# Patient Record
Sex: Male | Born: 1960
Health system: Southern US, Community
[De-identification: ages and names within clinical notes are randomized; demographics above are authoritative.]

---

## 2011-09-29 ENCOUNTER — Ambulatory Visit (INDEPENDENT_AMBULATORY_CARE_PROVIDER_SITE_OTHER): Payer: 59

## 2011-09-29 DIAGNOSIS — R509 Fever, unspecified: Secondary | ICD-10-CM

## 2011-11-19 ENCOUNTER — Other Ambulatory Visit: Payer: Self-pay | Admitting: Physician Assistant

## 2011-12-28 ENCOUNTER — Other Ambulatory Visit: Payer: Self-pay | Admitting: Physician Assistant

## 2011-12-29 NOTE — Telephone Encounter (Signed)
Please pull chat  Alycia Rossetti

## 2011-12-30 ENCOUNTER — Other Ambulatory Visit: Payer: Self-pay | Admitting: *Deleted

## 2012-01-01 ENCOUNTER — Ambulatory Visit (INDEPENDENT_AMBULATORY_CARE_PROVIDER_SITE_OTHER): Payer: 59 | Admitting: Family Medicine

## 2012-01-01 VITALS — BP 126/74 | HR 64 | Temp 98.3°F | Resp 16 | Ht 77.0 in | Wt 221.0 lb

## 2012-01-01 DIAGNOSIS — E785 Hyperlipidemia, unspecified: Secondary | ICD-10-CM

## 2012-01-01 LAB — COMPREHENSIVE METABOLIC PANEL
ALT: 18 U/L (ref 0–53)
AST: 21 U/L (ref 0–37)
Albumin: 4.2 g/dL (ref 3.5–5.2)
Alkaline Phosphatase: 55 U/L (ref 39–117)
BUN: 16 mg/dL (ref 6–23)
CO2: 28 mEq/L (ref 19–32)
Calcium: 9.3 mg/dL (ref 8.4–10.5)
Chloride: 107 mEq/L (ref 96–112)
Creat: 0.95 mg/dL (ref 0.50–1.35)
Glucose, Bld: 97 mg/dL (ref 70–99)
Potassium: 4.6 mEq/L (ref 3.5–5.3)
Sodium: 141 mEq/L (ref 135–145)
Total Bilirubin: 0.6 mg/dL (ref 0.3–1.2)
Total Protein: 7.1 g/dL (ref 6.0–8.3)

## 2012-01-01 LAB — LIPID PANEL
Cholesterol: 181 mg/dL (ref 0–200)
HDL: 44 mg/dL (ref >39)
LDL Cholesterol: 110 mg/dL — ABNORMAL HIGH (ref 0–99)
Total CHOL/HDL Ratio: 4.1 Ratio
Triglycerides: 134 mg/dL (ref <150)
VLDL: 27 mg/dL (ref 0–40)

## 2012-01-01 MED ORDER — ATORVASTATIN CALCIUM 10 MG PO TABS: 10.0000 mg | ORAL_TABLET | Freq: Every day | ORAL | Status: DC

## 2012-01-01 NOTE — Patient Instructions (Signed)

## 2012-01-01 NOTE — Progress Notes (Signed)
51 year old gentleman from [place] who comes in for his cholesterol check. He wrecked exercises regularly by running a mile and doing gym workout. He's had no problems with the myalgias or abdominal pain. He stopped taking his Lipitor 3 days ago when he ran out.  Objective: Healthy-appearing man in no acute distress  Heart: Regular no murmur  Chest: Clear  Abdomen: No hepatosplenomegaly, no HSM  Assessment stable on Lipitor 10 mg  Plan check labs and refill medicine

## 2012-01-02 ENCOUNTER — Telehealth: Payer: Self-pay

## 2012-01-02 NOTE — Telephone Encounter (Signed)
Pt would like to know the specifics of his blood work he knows we called him but he is filling out a form that has to have exact numbers

## 2012-01-02 NOTE — Telephone Encounter (Signed)
GAVE PT LAB VALUES HE NEEDED.

## 2013-01-28 ENCOUNTER — Other Ambulatory Visit: Payer: Self-pay | Admitting: Family Medicine

## 2013-03-10 ENCOUNTER — Ambulatory Visit (INDEPENDENT_AMBULATORY_CARE_PROVIDER_SITE_OTHER): Payer: 59 | Admitting: Family Medicine

## 2013-03-10 VITALS — BP 138/82 | HR 82 | Temp 98.0°F | Resp 18 | Ht 77.0 in | Wt 220.0 lb

## 2013-03-10 DIAGNOSIS — E785 Hyperlipidemia, unspecified: Secondary | ICD-10-CM

## 2013-03-10 DIAGNOSIS — M75100 Unspecified rotator cuff tear or rupture of unspecified shoulder, not specified as traumatic: Secondary | ICD-10-CM

## 2013-03-10 DIAGNOSIS — M7552 Bursitis of left shoulder: Secondary | ICD-10-CM

## 2013-03-10 LAB — COMPREHENSIVE METABOLIC PANEL
ALT: 17 U/L (ref 0–53)
AST: 18 U/L (ref 0–37)
CO2: 25 mEq/L (ref 19–32)
Calcium: 9 mg/dL (ref 8.4–10.5)
Chloride: 107 mEq/L (ref 96–112)
Sodium: 143 mEq/L (ref 135–145)
Total Bilirubin: 0.7 mg/dL (ref 0.3–1.2)
Total Protein: 6.9 g/dL (ref 6.0–8.3)

## 2013-03-10 LAB — LIPID PANEL
LDL Cholesterol: 92 mg/dL (ref 0–99)
VLDL: 30 mg/dL (ref 0–40)

## 2013-03-10 MED ORDER — ATORVASTATIN CALCIUM 10 MG PO TABS: 10.0000 mg | ORAL_TABLET | Freq: Every day | ORAL | Status: DC

## 2013-03-10 NOTE — Patient Instructions (Addendum)
Exercise shoulder as discussed. Continue same current medications. Return in one year, sooner if needed.  May take ibuprofen for the shoulder.

## 2013-03-10 NOTE — Progress Notes (Signed)
Subjective 52 year old man who is here for refill of his lipid-lowering agent. He was last here a little over a year ago. He works a Health and safety inspector job, but he does go to [employer] regularly. He maintained his weight stable. He has no major other risk factors. His father had some hypertension in his older years. The patient is under some stress in life. He is divorced, has a small son. Denies chest pains or cardiorespiratory symptoms. He does have some chronic pain in the left shoulder, which hurts with certain movements. He is taken some over-the-counter ibuprofen for that. He takes his medications faithfully.  Objective: Pleasant healthy-appearing man in no major distress. Neck supple without nodes. Chest clear. Heart regular without murmur. Left shoulder is tender in the subacromial area, not real bad today, but does have some limitation or range of motion.  Assessment: Upper lipidemia Left shoulder pain, probably from chronic subacromial bursitis.  Plan: Discussed passive exercises of the shoulder. If he gets worse can consider x-ray and injection. He can use the ibuprofen on a when necessary basis in the meanwhile.  Check lipids and comprehensive metabolic panel and refill his medication.

## 2013-03-11 ENCOUNTER — Encounter: Payer: Self-pay | Admitting: Family Medicine

## 2014-03-21 ENCOUNTER — Other Ambulatory Visit: Payer: Self-pay | Admitting: Family Medicine

## 2014-05-17 ENCOUNTER — Ambulatory Visit (INDEPENDENT_AMBULATORY_CARE_PROVIDER_SITE_OTHER): Payer: 59 | Admitting: Emergency Medicine

## 2014-05-17 VITALS — BP 126/82 | HR 60 | Temp 97.8°F | Resp 16 | Ht 78.0 in | Wt 220.8 lb

## 2014-05-17 DIAGNOSIS — E785 Hyperlipidemia, unspecified: Secondary | ICD-10-CM | POA: Insufficient documentation

## 2014-05-17 LAB — COMPLETE METABOLIC PANEL WITH GFR
ALK PHOS: 49 U/L (ref 39–117)
ALT: 20 U/L (ref 0–53)
AST: 23 U/L (ref 0–37)
Albumin: 4.3 g/dL (ref 3.5–5.2)
BILIRUBIN TOTAL: 0.9 mg/dL (ref 0.2–1.2)
BUN: 16 mg/dL (ref 6–23)
CO2: 29 mEq/L (ref 19–32)
Calcium: 9 mg/dL (ref 8.4–10.5)
Chloride: 103 mEq/L (ref 96–112)
Creat: 0.93 mg/dL (ref 0.50–1.35)
GFR, Est Non African American: >89 mL/min
Glucose, Bld: 102 mg/dL — ABNORMAL HIGH (ref 70–99)
Potassium: 4.6 mEq/L (ref 3.5–5.3)
SODIUM: 140 meq/L (ref 135–145)
TOTAL PROTEIN: 7 g/dL (ref 6.0–8.3)

## 2014-05-17 LAB — POCT CBC
Granulocyte percent: 63.8 %G (ref 37–80)
HEMATOCRIT: 48.1 % (ref 43.5–53.7)
Hemoglobin: 15.8 g/dL (ref 14.1–18.1)
LYMPH, POC: 1.5 (ref 0.6–3.4)
MCH, POC: 32.1 pg — AB (ref 27–31.2)
MCHC: 32.8 g/dL (ref 31.8–35.4)
MCV: 97.7 fL — AB (ref 80–97)
MID (CBC): 0.3 (ref 0–0.9)
MPV: 6.9 fL (ref 0–99.8)
POC GRANULOCYTE: 3.2 (ref 2–6.9)
POC LYMPH %: 30 % (ref 10–50)
POC MID %: 6.2 %M (ref 0–12)
Platelet Count, POC: 196 10*3/uL (ref 142–424)
RBC: 4.92 M/uL (ref 4.69–6.13)
RDW, POC: 13.6 %
WBC: 5 10*3/uL (ref 4.6–10.2)

## 2014-05-17 LAB — LIPID PANEL
CHOL/HDL RATIO: 3.7 ratio
Cholesterol: 166 mg/dL (ref 0–200)
HDL: 45 mg/dL (ref >39)
LDL CALC: 97 mg/dL (ref 0–99)
TRIGLYCERIDES: 119 mg/dL (ref <150)
VLDL: 24 mg/dL (ref 0–40)

## 2014-05-17 MED ORDER — ATORVASTATIN CALCIUM 10 MG PO TABS: ORAL_TABLET | ORAL | Status: DC

## 2014-05-17 NOTE — Progress Notes (Addendum)
Subjective:  This chart was scribed for Lesle Chris, MD by Carl Best, Medical Scribe. This patient was seen in Room 2 and the patient's care was started at 9:26 AM.   Patient ID: Christian Richards, male    DOB: 1961/05/18, 53 y.o.   MRN: [id number]  HPI HPI Comments: Christian Richards is a 53 y.o. male who presents to the [facility] for a refill of his Lipitor.  He gets a flight physical yearly and has not had a colonoscopy.     History reviewed. No pertinent past medical history. History reviewed. No pertinent past surgical history. History reviewed. No pertinent family history. History   Social History  . Marital Status: Single    Spouse Name: N/A    Number of Children: N/A  . Years of Education: N/A   Occupational History  . Not on file.   Social History Main Topics  . Smoking status: Never Smoker   . Smokeless tobacco: Not on file  . Alcohol Use: Yes     Comment: social  . Drug Use: No  . Sexual Activity: Not on file   Other Topics Concern  . Not on file   Social History Narrative  . No narrative on file   No Known Allergies  Review of Systems   Objective:  Physical Exam  Nursing note and vitals reviewed. Constitutional: He is oriented to person, place, and time. He appears well-developed and well-nourished.  HENT:  Head: Normocephalic and atraumatic.  Eyes: Conjunctivae and EOM are normal. Pupils are equal, round, and reactive to light.  Neck: Normal range of motion. Neck supple. No thyromegaly present.  Cardiovascular: Normal rate, regular rhythm and normal heart sounds.  Exam reveals no gallop and no friction rub.   No murmur heard. Pulmonary/Chest: Effort normal and breath sounds normal. No respiratory distress. He has no wheezes. He has no rales.  Musculoskeletal: Normal range of motion.  Lymphadenopathy:    He has no cervical adenopathy.  Neurological: He is alert and oriented to person, place, and time.  Skin: Skin is warm  and dry.  Psychiatric: He has a normal mood and affect. His behavior is normal.   Results for orders placed in visit on 05/17/14  POCT CBC      Result Value Ref Range   WBC 5.0  4.6 - 10.2 K/uL   Lymph, poc 1.5  0.6 - 3.4   POC LYMPH PERCENT 30.0  10 - 50 %L   MID (cbc) 0.3  0 - 0.9   POC MID % 6.2  0 - 12 %M   POC Granulocyte 3.2  2 - 6.9   Granulocyte percent 63.8  37 - 80 %G   RBC 4.92  4.69 - 6.13 M/uL   Hemoglobin 15.8  14.1 - 18.1 g/dL   HCT, POC 69.6  29.5 - 53.7 %   MCV 97.7 (*) 80 - 97 fL   MCH, POC 32.1 (*) 27 - 31.2 pg   MCHC 32.8  31.8 - 35.4 g/dL   RDW, POC 28.4     Platelet Count, POC 196  142 - 424 K/uL   MPV 6.9  0 - 99.8 fL    BP 126/82  Pulse 60  Temp(Src) 97.8 F (36.6 C) (Oral)  Resp 16  Ht 6\' 6"  (1.981 m)  Wt 220 lb 12.8 oz (100.154 kg)  BMI 25.52 kg/m2  SpO2 97% Assessment & Plan:  Only a brief physical was done. His Lipitor refilled. I  sent a referral for him to get scheduled  For a complete physical  I personally performed the services described in this documentation, which was scribed in my presence. The recorded information has been reviewed and is accurate.

## 2014-06-03 ENCOUNTER — Ambulatory Visit (INDEPENDENT_AMBULATORY_CARE_PROVIDER_SITE_OTHER): Payer: 59 | Admitting: Family Medicine

## 2014-06-03 VITALS — BP 128/66 | HR 60 | Temp 98.3°F | Resp 18 | Ht 78.0 in | Wt 216.0 lb

## 2014-06-03 DIAGNOSIS — M545 Low back pain, unspecified: Secondary | ICD-10-CM

## 2014-06-03 MED ORDER — PREDNISONE 20 MG PO TABS: 40.0000 mg | ORAL_TABLET | Freq: Every day | ORAL | Status: DC

## 2014-06-03 NOTE — Progress Notes (Signed)
   Patient ID: Christian Richards MRN: [id number], DOB: [birth year], 53 y.o. Date of Encounter: 06/03/2014, 10:13 AM  This chart was scribed for Christian Sidle, MD by Julian Hy, ED Scribe. The patient was seen in Room 8. The patient's care was started at 10:13 AM.   PCP: Lucilla Edin, MD   Chief Complaint  Patient presents with  . Back Pain    since this morning     HPI Comments: This is a 53 y.o.male who complains of new, severe right low back pain onset immediately prior to arrival. He describes his pain as "spasmodic, dull and tight". Pt notes he previously had some intermittent, dull right low back pain and right hip pain but notes his current pain is slightly different. His pain is worsened with twisting and turning to the right, and standing up. His pain radiates across the abdomen across his belly button. Christian Richards denies any urinary symptoms, bowel problems, radiating pain to his upper and lower extremities, numbness in the legs, loss of motor power, and fever. His pain is relieved once he is standing. Pt notes he frequently sits at work. Pt has been on Lipitor for 5 years.   Character of pain: Spasmodic, dull and tight Location of pain:  Low back Radiation of pain:  Across the abdomen  History reviewed. No pertinent past medical history.   History reviewed. No pertinent past surgical history.  Patient Active Problem List   Diagnosis Date Noted  . Other and unspecified hyperlipidemia 05/17/2014   History reviewed. No pertinent past medical history. History reviewed. No pertinent past surgical history. No Known Allergies Prior to Admission medications   Medication Sig Start Date End Date Taking? Authorizing Provider  atorvastatin (LIPITOR) 10 MG tablet TAKE 1 TABLET BY MOUTH DAILY. 05/17/14  Yes Collene Gobble, MD   History   Social History  . Marital Status: Single    Spouse Name: N/A    Number of Children: N/A  . Years of Education: N/A   Occupational  History  . Not on file.   Social History Main Topics  . Smoking status: Never Smoker   . Smokeless tobacco: Not on file  . Alcohol Use: Yes     Comment: social  . Drug Use: No  . Sexual Activity: Not on file   Other Topics Concern  . Not on file   Social History Narrative  . No narrative on file    Objective:  middle-aged male in no acute distress. Triage Vitals: Blood pressure 128/66, pulse 60, temperature 98.3 F (36.8 C), temperature source Oral, resp. rate 18, height  (1.981 m), weight 216 lb (97.977 kg), SpO2 97.00%.Body mass index is 24.97 kg/(m^2). Palpation of the back reveals no tenderness CVA:  nontender Abdomen: soft, mildly tender RLQ without guarding or rebound Peripheral pulses:  DP/PT normal Inspection of the back: Reveals no scoliosis Straight-leg raising: negative Motor exam of lower extremity: No abnormal weakness. Reflexes: Symmetric and normal Skin exam: normal  Assessment/Plan: Acute lower back pain without acute neurological findings. 53 y.o. male with Right-sided low back pain without sciatica - Plan: predniSONE (DELTASONE) 20 MG tablet   Hold the lipitor for 4 days as well   10:21 AM- Patient informed of current plan for treatment and evaluation and agrees with plan at this time.    Signed, Christian Sidle, MD  06/03/2014 10:23 AM

## 2014-06-21 ENCOUNTER — Ambulatory Visit (INDEPENDENT_AMBULATORY_CARE_PROVIDER_SITE_OTHER): Payer: 59 | Admitting: Emergency Medicine

## 2014-06-21 ENCOUNTER — Encounter: Payer: Self-pay | Admitting: Emergency Medicine

## 2014-06-21 ENCOUNTER — Ambulatory Visit (INDEPENDENT_AMBULATORY_CARE_PROVIDER_SITE_OTHER): Payer: 59

## 2014-06-21 ENCOUNTER — Other Ambulatory Visit: Payer: Self-pay | Admitting: Emergency Medicine

## 2014-06-21 VITALS — BP 130/70 | HR 61 | Temp 97.7°F | Resp 16 | Ht 77.75 in | Wt 217.0 lb

## 2014-06-21 DIAGNOSIS — M545 Low back pain, unspecified: Secondary | ICD-10-CM

## 2014-06-21 DIAGNOSIS — Z0000 Encounter for general adult medical examination without abnormal findings: Secondary | ICD-10-CM

## 2014-06-21 DIAGNOSIS — Z1329 Encounter for screening for other suspected endocrine disorder: Secondary | ICD-10-CM

## 2014-06-21 DIAGNOSIS — M542 Cervicalgia: Secondary | ICD-10-CM

## 2014-06-21 DIAGNOSIS — Z23 Encounter for immunization: Secondary | ICD-10-CM

## 2014-06-21 DIAGNOSIS — Z136 Encounter for screening for cardiovascular disorders: Secondary | ICD-10-CM

## 2014-06-21 DIAGNOSIS — Z125 Encounter for screening for malignant neoplasm of prostate: Secondary | ICD-10-CM

## 2014-06-21 DIAGNOSIS — Z1211 Encounter for screening for malignant neoplasm of colon: Secondary | ICD-10-CM

## 2014-06-21 DIAGNOSIS — E785 Hyperlipidemia, unspecified: Secondary | ICD-10-CM

## 2014-06-21 LAB — TSH: TSH: 1.292 u[IU]/mL (ref 0.350–4.500)

## 2014-06-21 LAB — POCT URINALYSIS DIPSTICK
Bilirubin, UA: NEGATIVE
Glucose, UA: NEGATIVE
Ketones, UA: NEGATIVE
Leukocytes, UA: NEGATIVE
Nitrite, UA: NEGATIVE
Protein, UA: NEGATIVE
Spec Grav, UA: 1.02
Urobilinogen, UA: 0.2
pH, UA: 5.5

## 2014-06-21 LAB — IFOBT (OCCULT BLOOD): IMMUNOLOGICAL FECAL OCCULT BLOOD TEST: NEGATIVE

## 2014-06-21 MED ORDER — ATORVASTATIN CALCIUM 10 MG PO TABS: ORAL_TABLET | ORAL | Status: DC

## 2014-06-21 NOTE — Progress Notes (Addendum)
Subjective:    Patient ID: Christian Richards, male    DOB: January 25, 1961, 53 y.o.   MRN: [id number] This chart was scribed for Collene Gobble, MD by Littie Deeds, ED Scribe. This patient was seen in room Room/bed 21 and the patient's care was started at 8:16 AM.   HPI HPI Comments: Christian Richards is a 53 y.o. male who presents to the Emergency Department for an annual exam. Patient is currently a single father who exercises regularly. His supervisor at work has Napoleon syndrome and has been giving the patient a hard time, but patient says the supervisor has been getting a bit better through the past 6-8 months. His blood pressure was measured to be 157/93 at the age of 79. He has a FMHx of CA. His mother had breast cancer that metastasized to the liver. His father had leukemia (myeloma) at the age of 57 and also had a hx of HTN, which he took medication for. Patient currently has a lower back pain, and was prescribed prednisone for the pain 2 weeks ago by Dr. Milus Glazier. The medication has been helping, but he still feels some pain with ROM. He notes a right sided neck pain that developed a few days ago that radiates to the right arm, but not to the hand. He has had stiff neck a few times this past year. He denies CP and abdominal pain. Patient also has some concern for his kidneys.    Review of Systems  Constitutional: Negative for fever.  Cardiovascular: Negative for chest pain.  Gastrointestinal: Negative for abdominal pain.  Musculoskeletal: Positive for back pain and neck pain.       Objective:   Physical Exam CONSTITUTIONAL: Well developed/well nourished HEAD: Normocephalic/atraumatic EYES: EOM/PERRL ENMT: Mucous membranes moist NECK: supple no meningeal signs SPINE: entire spine nontender CV: S1/S2 noted, no murmurs/rubs/gallops noted LUNGS: Lungs are clear to auscultation bilaterally, no apparent distress ABDOMEN: soft, nontender, no rebound or guarding GU: no cva  tenderness NEURO: Pt is awake/alert, moves all extremitiesx4 EXTREMITIES: pulses normal, full ROM, deep tendon reflexes of the upper extremities 2+ and symmetrical - motor strength 5/5, DTR lower reflexes are 2+ and symmetrical - motor strength 5/5, straight leg raises - right leg exhibits pain at 85 degrees SKIN: warm, color normal PSYCH: no abnormalities of mood noted RECTAL: exam normal, no nodularity [facility] reading (PRIMARY) by  Dr.Kenan Moodie there is C5-C6 degenerative disc disease on his x-rays. No definite abnormalities of the lumbar spine  Results for orders placed in visit on 05/17/14  LIPID PANEL      Result Value Ref Range   Cholesterol 166  0 - 200 mg/dL   Triglycerides 010  <272 mg/dL   HDL 45  >53 mg/dL   Total CHOL/HDL Ratio 3.7     VLDL 24  0 - 40 mg/dL   LDL Cholesterol 97  0 - 99 mg/dL  COMPLETE METABOLIC PANEL WITH GFR      Result Value Ref Range   Sodium 140  135 - 145 mEq/L   Potassium 4.6  3.5 - 5.3 mEq/L   Chloride 103  96 - 112 mEq/L   CO2 29  19 - 32 mEq/L   Glucose, Bld 102 (*) 70 - 99 mg/dL   BUN 16  6 - 23 mg/dL   Creat 6.64  4.03 - 4.74 mg/dL   Total Bilirubin 0.9  0.2 - 1.2 mg/dL   Alkaline Phosphatase 49  39 - 117 U/L   AST 23  0 - 37 U/L   ALT 20  0 - 53 U/L   Total Protein 7.0  6.0 - 8.3 g/dL   Albumin 4.3  3.5 - 5.2 g/dL   Calcium 9.0  8.4 - 91.4 mg/dL   GFR, Est African American >89     GFR, Est Non African American >89    POCT CBC      Result Value Ref Range   WBC 5.0  4.6 - 10.2 K/uL   Lymph, poc 1.5  0.6 - 3.4   POC LYMPH PERCENT 30.0  10 - 50 %L   MID (cbc) 0.3  0 - 0.9   POC MID % 6.2  0 - 12 %M   POC Granulocyte 3.2  2 - 6.9   Granulocyte percent 63.8  37 - 80 %G   RBC 4.92  4.69 - 6.13 M/uL   Hemoglobin 15.8  14.1 - 18.1 g/dL   HCT, POC 78.2  95.6 - 53.7 %   MCV 97.7 (*) 80 - 97 fL   MCH, POC 32.1 (*) 27 - 31.2 pg   MCHC 32.8  31.8 - 35.4 g/dL   RDW, POC 21.3     Platelet Count, POC 196  142 - 424 K/uL   MPV 6.9  0 - 99.8 fL         Assessment & Plan:  Patient given flu vaccine and TDap. Referral made for screening colonoscopy. Overall he is doing well maintains a healthy lifestyle and is enjoying being with his 61-year-old son. Referral made to [facility] orthopedics to help with his neck and back discomfort I personally performed the services described in this documentation, which was scribed in my presence. The recorded information has been reviewed and is accurate.

## 2014-06-21 NOTE — Progress Notes (Deleted)
   Subjective:    Patient ID: Christian Richards, male    DOB: March 12, 1961, 53 y.o.   MRN: [id number]  HPI    Review of Systems  Constitutional: Negative.   HENT: Negative.   Eyes: Negative.   Respiratory: Negative.   Cardiovascular: Negative.   Gastrointestinal: Negative.   Endocrine: Negative.   Genitourinary: Negative.   Musculoskeletal: Positive for arthralgias, back pain and neck pain.  Allergic/Immunologic: Negative.   Neurological: Negative.   Hematological: Negative.   Psychiatric/Behavioral: Negative.        Objective:   Physical Exam        Assessment & Plan:

## 2014-06-22 LAB — PSA: PSA: 1.97 ng/mL (ref <=4.00)

## 2014-07-22 ENCOUNTER — Encounter: Payer: Self-pay | Admitting: Emergency Medicine

## 2015-07-04 ENCOUNTER — Encounter: Payer: Self-pay | Admitting: Emergency Medicine

## 2015-07-24 ENCOUNTER — Other Ambulatory Visit: Payer: Self-pay | Admitting: Emergency Medicine

## 2015-09-15 ENCOUNTER — Ambulatory Visit (INDEPENDENT_AMBULATORY_CARE_PROVIDER_SITE_OTHER): Payer: 59 | Admitting: Family Medicine

## 2015-09-15 VITALS — BP 114/70 | HR 67 | Temp 98.0°F | Resp 14 | Ht 77.75 in | Wt 220.0 lb

## 2015-09-15 DIAGNOSIS — M25512 Pain in left shoulder: Secondary | ICD-10-CM

## 2015-09-15 DIAGNOSIS — E785 Hyperlipidemia, unspecified: Secondary | ICD-10-CM

## 2015-09-15 LAB — COMPLETE METABOLIC PANEL WITH GFR
ALT: 18 U/L (ref 9–46)
AST: 19 U/L (ref 10–35)
Albumin: 4.2 g/dL (ref 3.6–5.1)
Alkaline Phosphatase: 53 U/L (ref 40–115)
BUN: 15 mg/dL (ref 7–25)
CO2: 30 mmol/L (ref 20–31)
Calcium: 9.3 mg/dL (ref 8.6–10.3)
Chloride: 104 mmol/L (ref 98–110)
Creat: 0.95 mg/dL (ref 0.70–1.33)
GFR, Est African American: >89 mL/min (ref >=60)
GLUCOSE: 86 mg/dL (ref 65–99)
POTASSIUM: 4.9 mmol/L (ref 3.5–5.3)
SODIUM: 141 mmol/L (ref 135–146)
TOTAL PROTEIN: 6.9 g/dL (ref 6.1–8.1)
Total Bilirubin: 0.8 mg/dL (ref 0.2–1.2)

## 2015-09-15 LAB — LIPID PANEL
CHOL/HDL RATIO: 3.6 ratio (ref <=5.0)
CHOLESTEROL: 160 mg/dL (ref 125–200)
HDL: 44 mg/dL (ref >=40)
LDL Cholesterol: 94 mg/dL (ref <130)
Triglycerides: 109 mg/dL (ref <150)
VLDL: 22 mg/dL (ref <30)

## 2015-09-15 NOTE — Progress Notes (Signed)
Patient ID: Christian Richards, male    DOB: 06/20/1961  Age: 54 y.o. MRN: [id number]  Chief Complaint  Patient presents with  . Medication Refill    Lipitor    Subjective:   54 year old man here for a checkup with regard to his lipids. He was asking to see Dr. Dorinda Hillonald, who spoke with him but I saw the patient for his exam. He has no major complaints. No headaches or dizziness no chest pains. No shortness of breath. No GI or GU complaints. He is 54. Has been on medication for lipids and needs a refill for that.  He works at a tobacco company. He attends [employer]. He exercises. Never had a colonoscopy though he did a Hemoccult type testing through the job place.  Current allergies, medications, problem list, past/family and social histories reviewed.  Objective:  BP 114/70 mmHg  Pulse 67  Temp(Src) 98 F (36.7 C) (Oral)  Resp 14  Ht 6' 5.75" (1.975 m)  Wt 220 lb (99.791 kg)  BMI 25.58 kg/m2  SpO2 99%  No major acute distress. TMs normal. Throat clear. Neck supple without nodes. Chest clear. Heart regular without murmur. Abdomen soft without masses or if has left shoulder which hurts him. He has seen orthopedist for that but didn't get much help with the cortisone shot.  Assessment & Plan:   Assessment: 1. Hyperlipidemia       Plan:   Orders Placed This Encounter  Procedures  . COMPLETE METABOLIC PANEL WITH GFR  . Lipid panel    No orders of the defined types were placed in this encounter.         Patient Instructions  I will let you know the results of your labs in a few days  Encourage you to get a colonoscopy  If you decide you wish to get a referral to let physical therapy work with her shoulder or to see a different orthopedist for it be happy to do so  Return in one year for recheck, sooner if there are concerns from your labs.    No Follow-up on file.   HOPPER,DAVID, MD 09/15/2015

## 2015-09-15 NOTE — Patient Instructions (Signed)
I will let you know the results of your labs in a few days  Encourage you to get a colonoscopy  If you decide you wish to get a referral to let physical therapy work with her shoulder or to see a different orthopedist for it be happy to do so  Return in one year for recheck, sooner if there are concerns from your labs.

## 2015-09-16 ENCOUNTER — Encounter: Payer: Self-pay | Admitting: *Deleted

## 2015-09-19 ENCOUNTER — Other Ambulatory Visit: Payer: Self-pay | Admitting: Emergency Medicine

## 2015-09-21 ENCOUNTER — Telehealth: Payer: Self-pay

## 2015-10-09 IMAGING — CR DG LUMBAR SPINE 2-3V
3 series · 3 of 3 positions shown · non-contrast
Comparison: None.

CLINICAL DATA: Low back pain radiating into the right leg.

EXAM:
LUMBAR SPINE - 2-3 VIEW

[AP (1 of 2)]
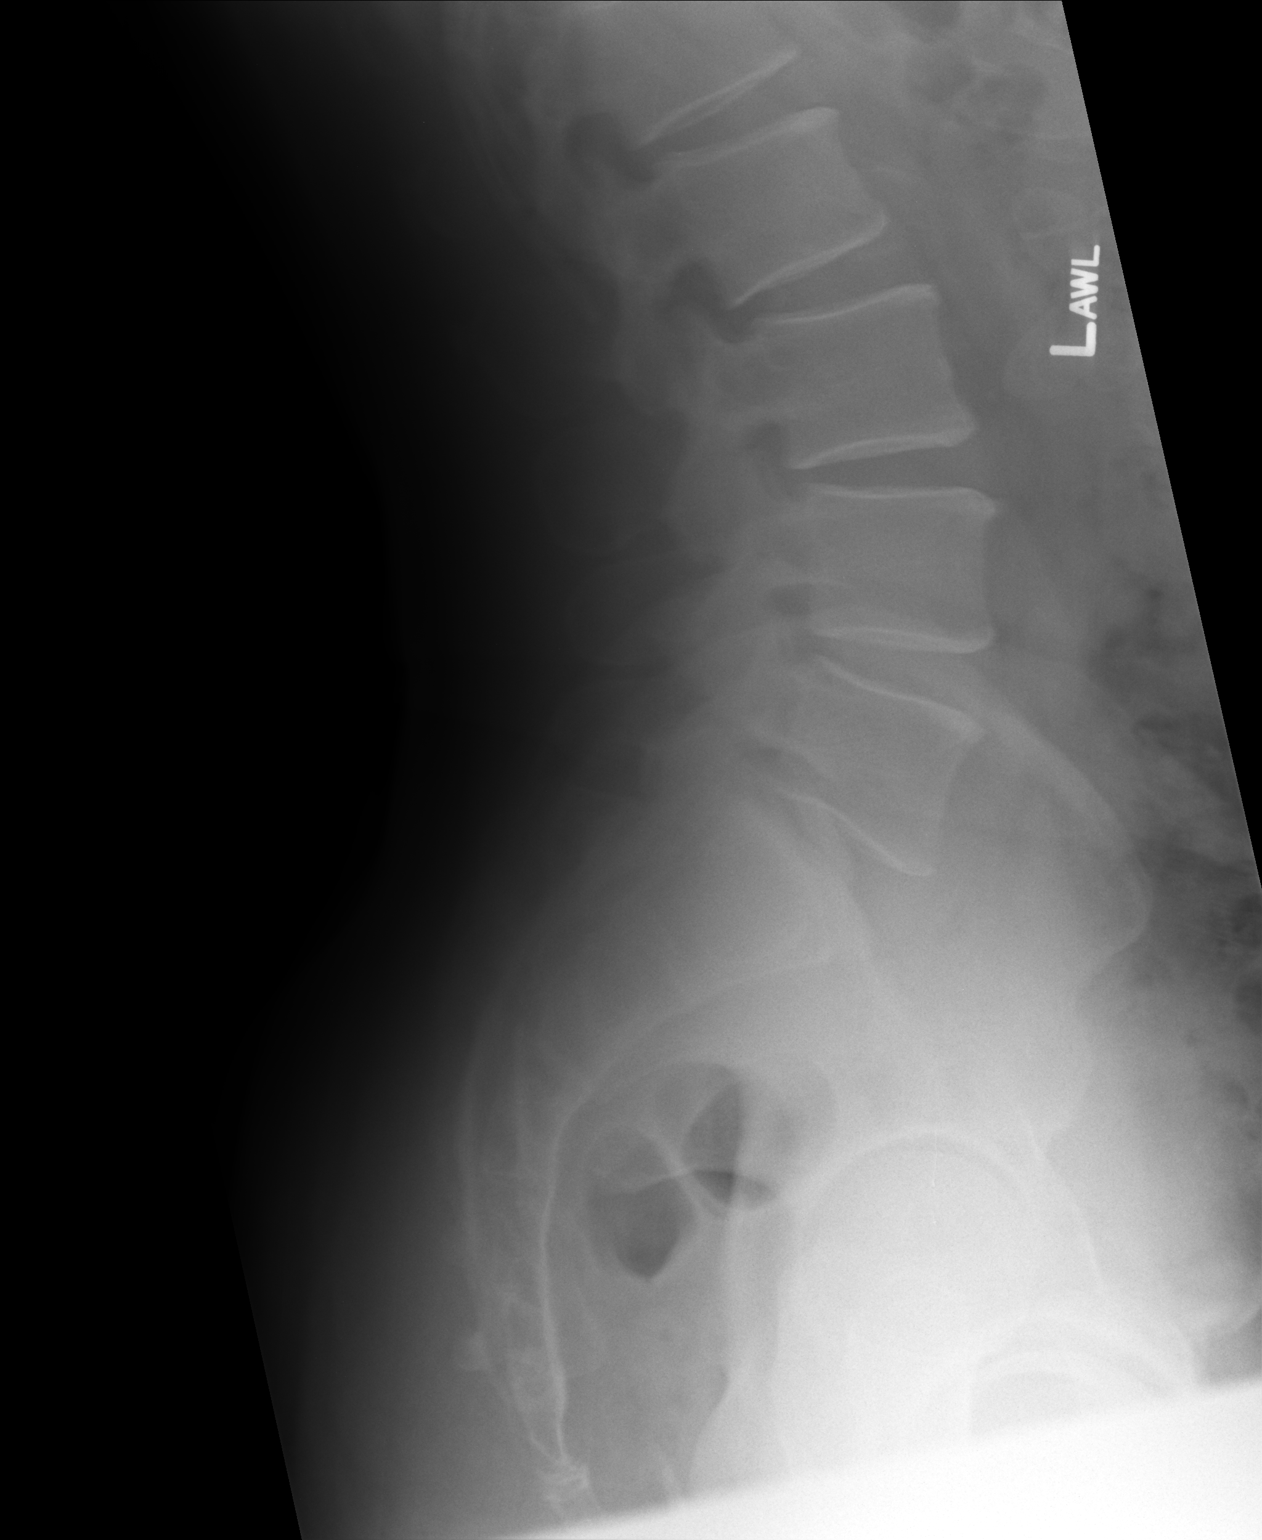

[AP (2 of 2)]
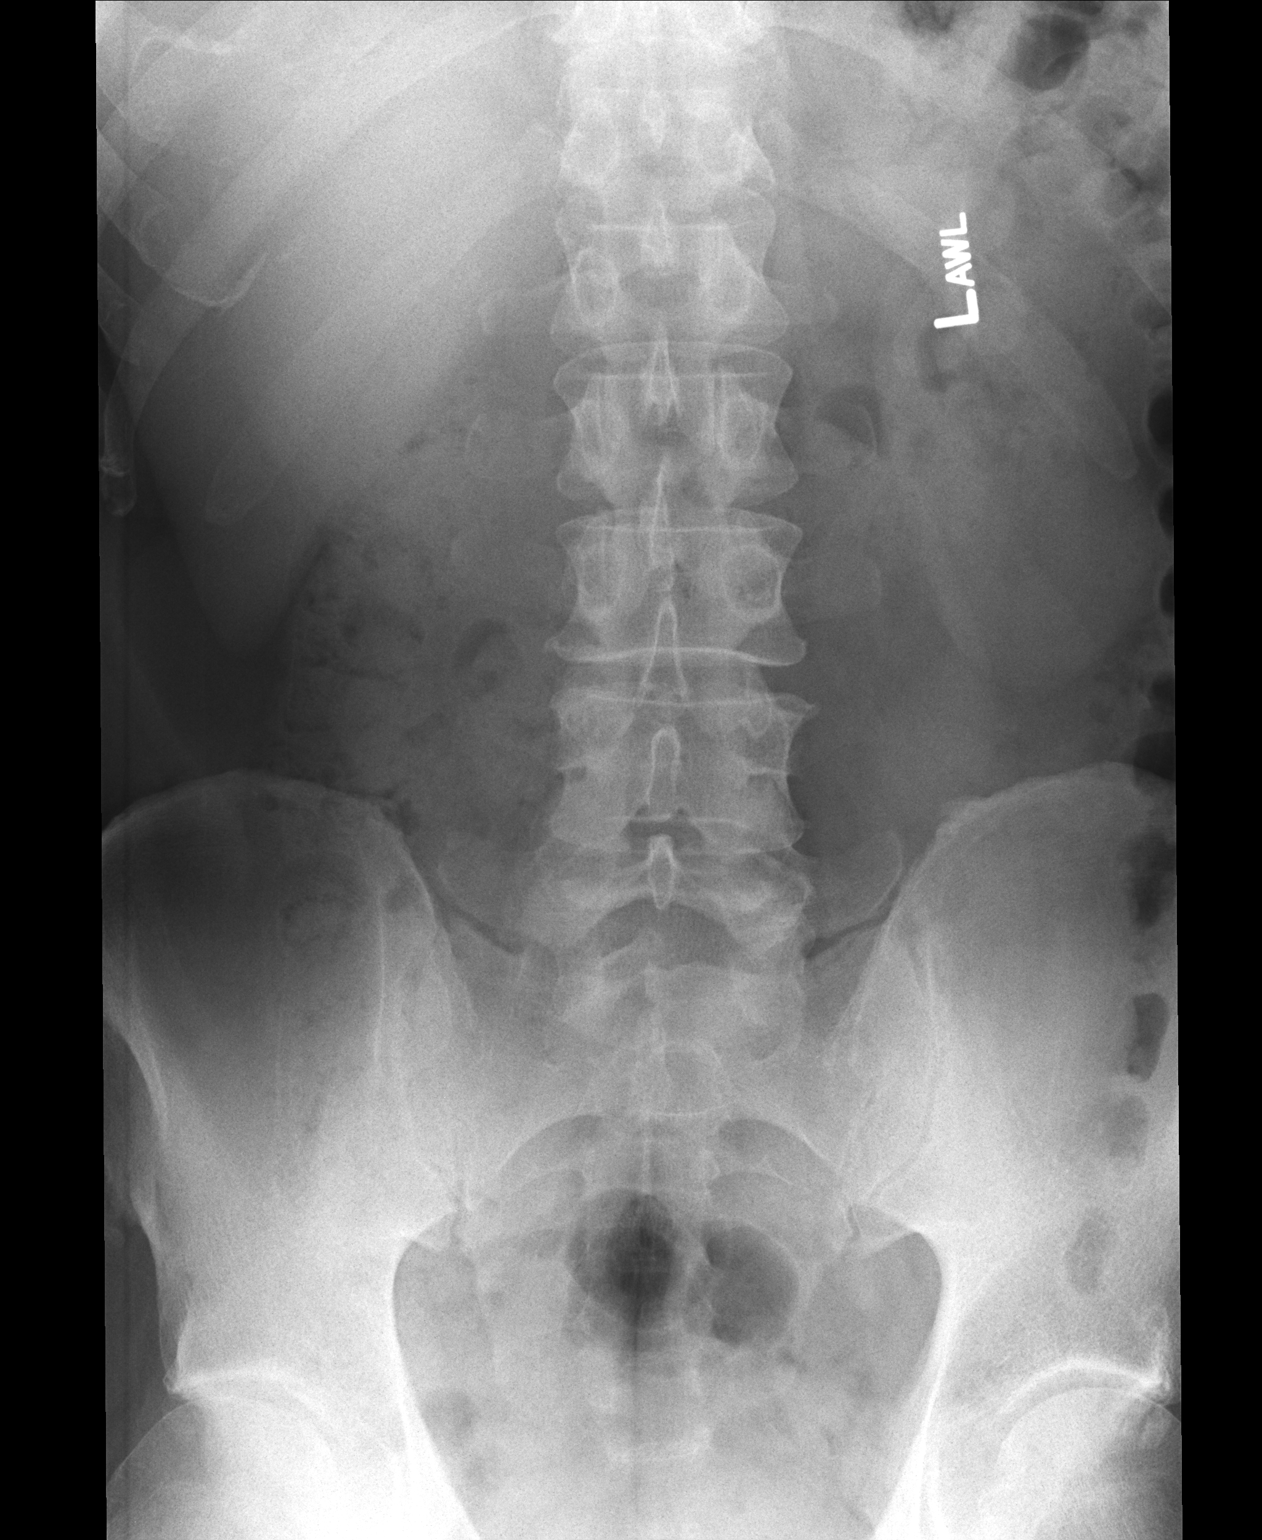

[lateral]
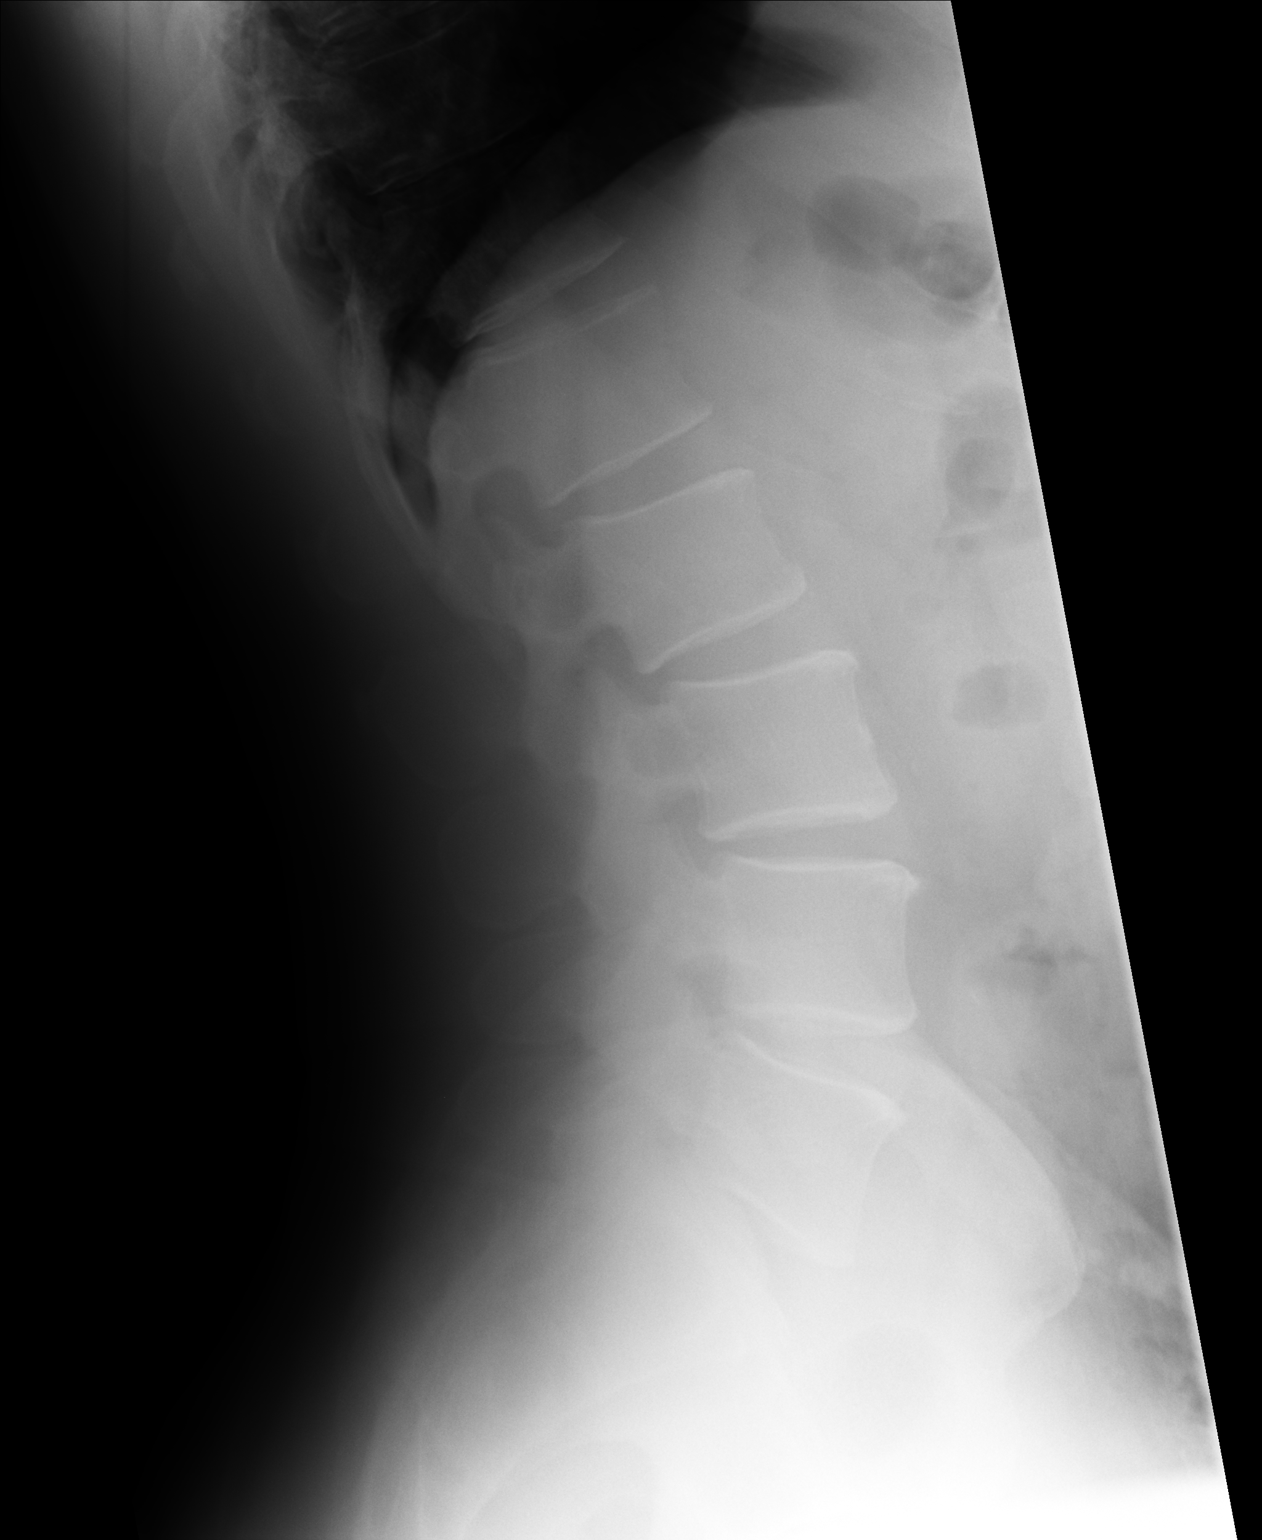

[3 of 3 positions shown; findings below may reference images not displayed]

FINDINGS: There is no evidence of lumbar spine fracture. Alignment is normal.
Intervertebral disc spaces are maintained.
IMPRESSION: Negative exam.

## 2015-10-09 IMAGING — CR DG CERVICAL SPINE 2 OR 3 VIEWS
3 series · 3 of 3 positions shown · non-contrast
Comparison: None.

CLINICAL DATA: Pain with radicular symptoms

EXAM:
CERVICAL SPINE - 2-3 VIEW

[ap open mouth]
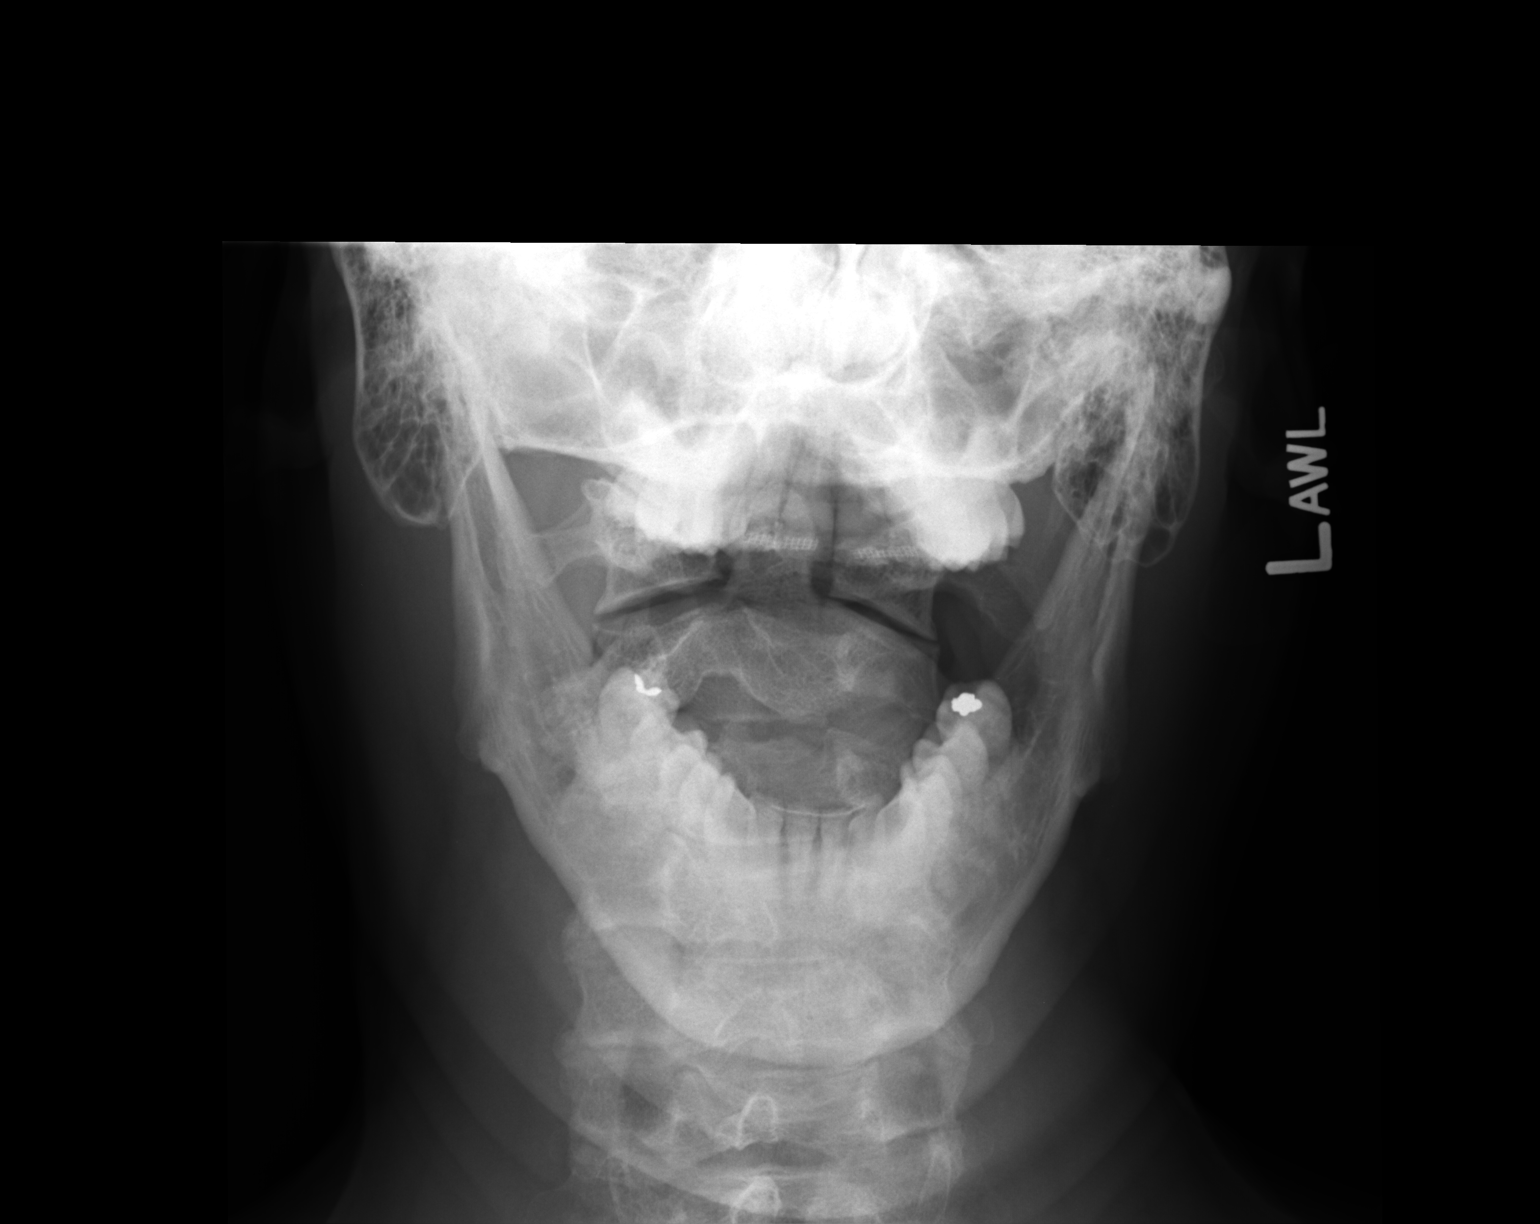

[AP]
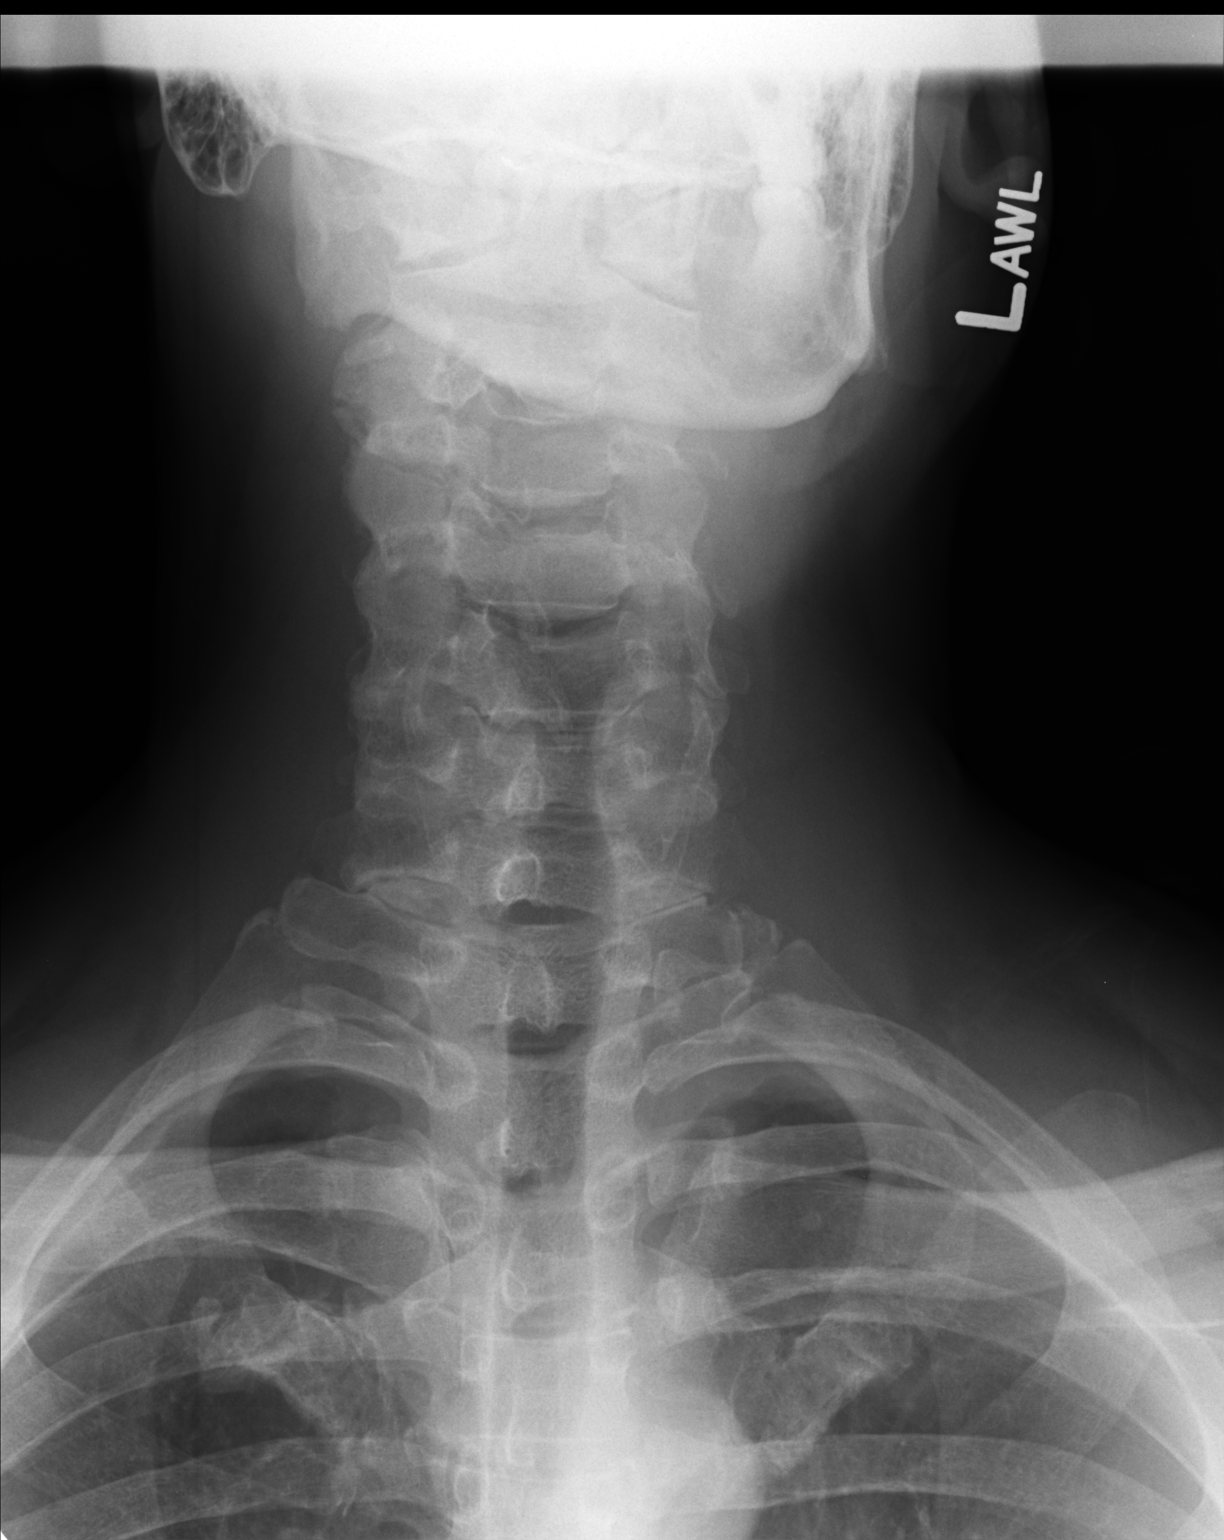

[lateral]
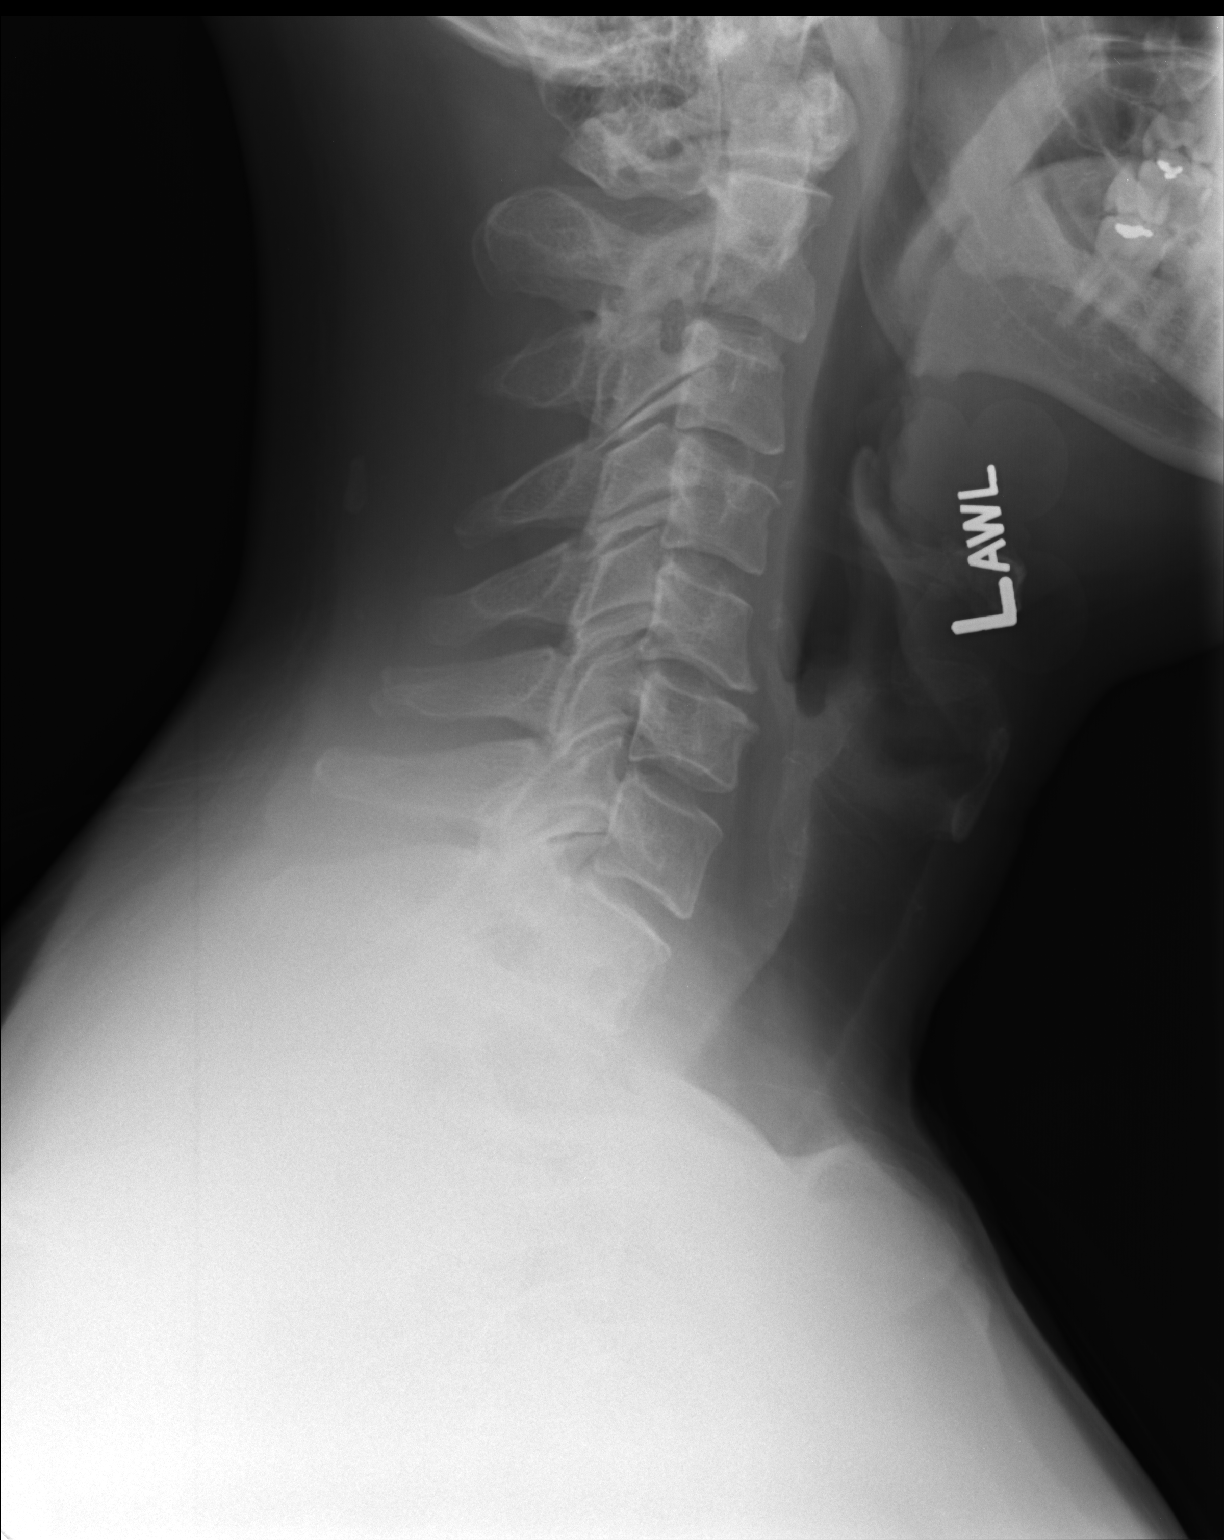

[3 of 3 positions shown; findings below may reference images not displayed]

FINDINGS: Frontal, lateral, and open-mouth odontoid images were obtained.
There is no demonstrable fracture or spondylolisthesis. On the
open-mouth odontoid image, there is a Mach band extending through a
portion of the odontoid, a recognized artifact. Prevertebral soft
tissues and predental space regions are normal. There is moderate
disc space narrowing at C5-6. Other disc spaces appear normal. There
are anterior osteophytes at C4, C5, and C6. No erosive change. There
is relative lack of lordosis.
IMPRESSION: Osteoarthritic change, most notable at C5-6. No fracture or
spondylolisthesis. Lack of lordosis is probably indicative of a
degree of muscle spasm. If there is concern for ligamentous injury,
lateral flexion-extension views could be helpful to further assess.

## 2016-01-08 NOTE — Telephone Encounter (Signed)
No msg °

## 2016-08-19 ENCOUNTER — Other Ambulatory Visit: Payer: Self-pay | Admitting: Family Medicine

## 2016-08-20 NOTE — Telephone Encounter (Signed)
Advised pt that I will send in a 1 mos RF, but he needs to call to sch appt w/in the month. Explained same day appts. Pt agreed.

## 2016-08-29 ENCOUNTER — Ambulatory Visit (INDEPENDENT_AMBULATORY_CARE_PROVIDER_SITE_OTHER): Payer: 59 | Admitting: Physician Assistant

## 2016-08-29 VITALS — BP 130/94 | HR 65 | Temp 97.9°F | Resp 17 | Ht 77.25 in | Wt 217.0 lb

## 2016-08-29 DIAGNOSIS — Z114 Encounter for screening for human immunodeficiency virus [HIV]: Secondary | ICD-10-CM

## 2016-08-29 DIAGNOSIS — R030 Elevated blood-pressure reading, without diagnosis of hypertension: Secondary | ICD-10-CM | POA: Diagnosis not present

## 2016-08-29 DIAGNOSIS — Z0000 Encounter for general adult medical examination without abnormal findings: Secondary | ICD-10-CM

## 2016-08-29 DIAGNOSIS — Z1159 Encounter for screening for other viral diseases: Secondary | ICD-10-CM | POA: Diagnosis not present

## 2016-08-29 DIAGNOSIS — Z130 Encounter for screening for diseases of the blood and blood-forming organs and certain disorders involving the immune mechanism: Secondary | ICD-10-CM

## 2016-08-29 DIAGNOSIS — E785 Hyperlipidemia, unspecified: Secondary | ICD-10-CM | POA: Diagnosis not present

## 2016-08-29 LAB — COMPLETE METABOLIC PANEL WITH GFR
ALBUMIN: 4.2 g/dL (ref 3.6–5.1)
ALK PHOS: 54 U/L (ref 40–115)
ALT: 18 U/L (ref 9–46)
AST: 21 U/L (ref 10–35)
BILIRUBIN TOTAL: 1 mg/dL (ref 0.2–1.2)
BUN: 15 mg/dL (ref 7–25)
CALCIUM: 9.2 mg/dL (ref 8.6–10.3)
CO2: 28 mmol/L (ref 20–31)
CREATININE: 0.94 mg/dL (ref 0.70–1.33)
Chloride: 105 mmol/L (ref 98–110)
GFR, Est African American: >89 mL/min (ref >=60)
GFR, Est Non African American: >89 mL/min (ref >=60)
Glucose, Bld: 97 mg/dL (ref 65–99)
POTASSIUM: 4.7 mmol/L (ref 3.5–5.3)
Sodium: 140 mmol/L (ref 135–146)
TOTAL PROTEIN: 7 g/dL (ref 6.1–8.1)

## 2016-08-29 LAB — CBC WITH DIFFERENTIAL/PLATELET
BASOS ABS: 54 {cells}/uL (ref 0–200)
Basophils Relative: 1 %
Eosinophils Absolute: 108 cells/uL (ref 15–500)
Eosinophils Relative: 2 %
HEMATOCRIT: 49.2 % (ref 38.5–50.0)
HEMOGLOBIN: 16.8 g/dL (ref 13.2–17.1)
LYMPHS ABS: 1566 {cells}/uL (ref 850–3900)
Lymphocytes Relative: 29 %
MCH: 33.3 pg — AB (ref 27.0–33.0)
MCHC: 34.1 g/dL (ref 32.0–36.0)
MCV: 97.4 fL (ref 80.0–100.0)
MONO ABS: 378 {cells}/uL (ref 200–950)
MPV: 10 fL (ref 7.5–12.5)
Monocytes Relative: 7 %
NEUTROS PCT: 61 %
Neutro Abs: 3294 cells/uL (ref 1500–7800)
Platelets: 201 10*3/uL (ref 140–400)
RBC: 5.05 MIL/uL (ref 4.20–5.80)
RDW: 13.8 % (ref 11.0–15.0)
WBC: 5.4 10*3/uL (ref 3.8–10.8)

## 2016-08-29 LAB — LIPID PANEL
CHOL/HDL RATIO: 4.5 ratio (ref <5.0)
CHOLESTEROL: 184 mg/dL (ref <200)
HDL: 41 mg/dL (ref >40)
LDL Cholesterol: 111 mg/dL — ABNORMAL HIGH (ref <100)
Triglycerides: 162 mg/dL — ABNORMAL HIGH (ref <150)
VLDL: 32 mg/dL — ABNORMAL HIGH (ref <30)

## 2016-08-29 LAB — HIV ANTIBODY (ROUTINE TESTING W REFLEX): HIV: NONREACTIVE

## 2016-08-29 LAB — HEPATITIS C ANTIBODY: HCV AB: NEGATIVE

## 2016-08-29 MED ORDER — ATORVASTATIN CALCIUM 10 MG PO TABS: 10.0000 mg | ORAL_TABLET | Freq: Every day | ORAL | 3 refills | Status: DC

## 2016-08-29 NOTE — Progress Notes (Signed)
Christian Richards  MRN: [id number] DOB: 01/25/1961  Subjective:  Pt is a 55 y.o. male who presents for annual physical exam.  Hyperlipidemia: Has been on atorvastatin 10mg  for quite sometime, he cannot recall how long but notes it has been longer than 4 years.   Diet: Notes it is terrible at times and okay at times. He eats veggies every now and then. He mostly chicken and fish. He does eat quite a bit of fast food. He eats a lot of fires.  Does not drink a lot of water but does drink coffee. Takes a multivitamin.   Exercise: He goes to the gym three times a week. He does circuits of machines and then does a mile on a machine.   Sleep: Pt sleeps about 6-7 hours a night. Feels rested throughout the day.   Social: Works as a Secondary school teachertechnical specialist. He is divorced and has one 397 yo child. Pt is not sexually active.   Last dental exam: 06/2016 Last vision exam: 2016  Last colonoscopy: Never but has completed cologaurd 11/2015: normal  Vaccinations      Tetanus: 06/21/14       Patient Active Problem List   Diagnosis Date Noted  . Other and unspecified hyperlipidemia 05/17/2014    Current Outpatient Prescriptions on File Prior to Visit  Medication Sig Dispense Refill  . aspirin 325 MG tablet Take 325 mg by mouth daily.    Marland Kitchen. atorvastatin (LIPITOR) 10 MG tablet TAKE 1 TABLET BY MOUTH DAILY 30 tablet 0  . Multiple Vitamin (MULTIVITAMIN) tablet Take 1 tablet by mouth daily.    Marland Kitchen. OVER THE COUNTER MEDICATION OTC cimetidine taking    . OVER THE COUNTER MEDICATION OTC Fish oil taking     No current facility-administered medications on file prior to visit.     No Known Allergies  Social History   Social History  . Marital status: Single    Spouse name: N/A  . Number of children: N/A  . Years of education: N/A   Occupational History  . Manager at [employer]   Social History Main Topics  . Smoking status: Never Smoker  . Smokeless tobacco: Never Used  .  Alcohol use Yes     Comment: social 5-6  . Drug use: No  . Sexual activity: Not Asked   Other Topics Concern  . None   Social History Narrative   Divorced. Exercise: College.   No past surgical history on file.  Family History  Problem Relation Age of Onset  . Cancer Mother   . Hypertension Father   . Leukemia Father     Review of Systems  Objective:  BP (!) 130/94 (BP Location: Left Arm, Patient Position: Sitting, Cuff Size: Normal)   Pulse 65   Temp 97.9 F (36.6 C) (Oral)   Resp 17   Ht 6' 5.25" (1.962 m)   Wt 217 lb (98.4 kg)   SpO2 97%   BMI 25.57 kg/m   Physical Exam  Constitutional: He is oriented to person, place, and time and well-developed, well-nourished, and in no distress.  HENT:  Head: Normocephalic and atraumatic.  Right Ear: Hearing, tympanic membrane, external ear and ear canal normal.  Left Ear: Hearing, tympanic membrane, external ear and ear canal normal.  Nose: Nose normal.  Mouth/Throat: Uvula is midline, oropharynx is clear and moist and mucous membranes are normal. No oropharyngeal exudate.  Polyp noted on uvula.   Eyes: Conjunctivae and EOM are normal. Pupils  are equal, round, and reactive to light.  Fundoscopic exam:      The right eye shows red reflex.       The left eye shows red reflex.  Neck: Trachea normal and normal range of motion.  Cardiovascular: Normal rate, regular rhythm, normal heart sounds and intact distal pulses.   Pulmonary/Chest: Effort normal and breath sounds normal.  Abdominal: Soft. Normal appearance and bowel sounds are normal.  Genitourinary: Prostate normal.  Musculoskeletal: Normal range of motion.  Lymphadenopathy:       Head (right side): No submental, no submandibular, no tonsillar, no preauricular, no posterior auricular and no occipital adenopathy present.       Head (left side): No submental, no submandibular, no tonsillar, no preauricular, no posterior auricular and no occipital adenopathy present.    He  has no cervical adenopathy.       Right: No supraclavicular adenopathy present.       Left: No supraclavicular adenopathy present.  Neurological: He is alert and oriented to person, place, and time. He has normal sensation, normal strength and normal reflexes. Gait normal.  Skin: Skin is warm and dry.  Psychiatric: Affect normal.  Vitals reviewed.   Visual Acuity Screening   Right eye Left eye Both eyes  Without correction: 20/20 20/20 20/15   With correction:      BP Readings from Last 3 Encounters:  08/29/16 (!) 130/94  09/15/15 114/70  06/21/14 130/70    Assessment and Plan :  Discussed healthy lifestyle, diet, exercise, preventative care, vaccinations, and addressed patient's concerns. Plan for follow up in one year. Otherwise, plan for specific conditions below.   There are no diagnoses linked to this encounter.  1. Annual physical exam Await lab results. Work on dietary changes.   2. Screening, anemia, deficiency, iron - CBC with Differential/Platelet  3. Hyperlipidemia, unspecified hyperlipidemia type - COMPLETE METABOLIC PANEL WITH GFR - Lipid panel  4. Need for hepatitis C screening test - Hepatitis C antibody  5. Screening for HIV (human immunodeficiency virus) - HIV antibody  6. Elevated blood pressure reading -Pt has been checking his bp regularly at the gym and notes it is most often in the 120s/70-80s range. Instructed to continue monitoring this and if he has consistently elevated bp readings, return to clinic for further evaluation.   Benjiman CoreBrittany Betsaida [place] PA-C  [facility] 08/29/2016 8:57 AM

## 2016-08-29 NOTE — Patient Instructions (Addendum)
Keep up the good work! Start eating a better diet and increase your water consumption.   Thank you for letting me participate in your health and well being.    IF you received an x-ray today, you will receive an invoice from [facility]. Please contact [facility] at [phone] with questions or concerns regarding your invoice.   IF you received labwork today, you will receive an invoice from [facility]. Please contact [facility] at [phone] with questions or concerns regarding your invoice.   Our billing staff will not be able to assist you with questions regarding bills from these companies.  You will be contacted with the lab results as soon as they are available. The fastest way to get your results is to activate your My Chart account. Instructions are located on the last page of this paperwork. If you have not heard from us regarding the results in 2 weeks, please contact this office.     Fat and Cholesterol Restricted Diet Introduction Getting too much fat and cholesterol in your diet may cause health problems. Following this diet helps keep your fat and cholesterol at normal levels. This can keep you from getting sick. What types of fat should I choose?  Choose monosaturated and polyunsaturated fats. These are found in foods such as olive oil, canola oil, flaxseeds, walnuts, almonds, and seeds.  Eat more omega-3 fats. Good choices include salmon, mackerel, sardines, tuna, flaxseed oil, and ground flaxseeds.  Limit saturated fats. These are in animal products such as meats, butter, and cream. They can also be in plant products such as palm oil, palm kernel oil, and coconut oil.  Avoid foods with partially hydrogenated oils in them. These contain trans fats. Examples of foods that have trans fats are stick margarine, some tub margarines, cookies, crackers, and other baked goods. What general guidelines do I need to follow?  Check  food labels. Look for the words "trans fat" and "saturated fat."  When preparing a meal:  Fill half of your plate with vegetables and green salads.  Fill one fourth of your plate with whole grains. Look for the word "whole" as the first word in the ingredient list.  Fill one fourth of your plate with lean protein foods.  Eat more foods that have fiber, like apples, carrots, beans, peas, and barley.  Eat more home-cooked foods. Eat less at restaurants and buffets.  Limit or avoid alcohol.  Limit foods high in starch and sugar.  Limit fried foods.  Cook foods without frying them. Baking, boiling, grilling, and broiling are all great options.  Lose weight if you are overweight. Losing even a small amount of weight can help your overall health. It can also help prevent diseases such as diabetes and heart disease. What foods can I eat? Grains  Whole grains, such as whole wheat or whole grain breads, crackers, cereals, and pasta. Unsweetened oatmeal, bulgur, barley, quinoa, or brown rice. Corn or whole wheat flour tortillas. Vegetables  Fresh or frozen vegetables (raw, steamed, roasted, or grilled). Green salads. Fruits  All fresh, canned (in natural juice), or frozen fruits. Meat and Other Protein Products  Ground beef (85% or leaner), grass-fed beef, or beef trimmed of fat. Skinless chicken or [place]. Ground chicken or [place]. Pork trimmed of fat. All fish and seafood. Eggs. Dried beans, peas, or lentils. Unsalted nuts or seeds. Unsalted canned or dry beans. Dairy  Low-fat dairy products, such as skim or 1% milk, 2% or reduced-fat cheeses, low-fat ricotta  or cottage cheese, or plain low-fat yogurt. Fats and Oils  Tub margarines without trans fats. Light or reduced-fat mayonnaise and salad dressings. Avocado. Olive, canola, sesame, or safflower oils. Natural peanut or almond butter (choose ones without added sugar and oil). The items listed above may not be a complete list of  recommended foods or beverages. Contact your dietitian for more options.  What foods are not recommended? Grains  White bread. White pasta. White rice. Cornbread. Bagels, pastries, and croissants. Crackers that contain trans fat. Vegetables  White potatoes. Corn. Creamed or fried vegetables. Vegetables in a cheese sauce. Fruits  Dried fruits. Canned fruit in light or heavy syrup. Fruit juice. Meat and Other Protein Products  Fatty cuts of meat. Ribs, chicken wings, bacon, sausage, bologna, salami, chitterlings, fatback, hot dogs, bratwurst, and packaged luncheon meats. Liver and organ meats. Dairy  Whole or 2% milk, cream, half-and-half, and cream cheese. Whole milk cheeses. Whole-fat or sweetened yogurt. Full-fat cheeses. Nondairy creamers and whipped toppings. Processed cheese, cheese spreads, or cheese curds. Sweets and Desserts  Corn syrup, sugars, honey, and molasses. Candy. Jam and jelly. Syrup. Sweetened cereals. Cookies, pies, cakes, donuts, muffins, and ice cream. Fats and Oils  Butter, stick margarine, lard, shortening, ghee, or bacon fat. Coconut, palm kernel, or palm oils. Beverages  Alcohol. Sweetened drinks (such as sodas, lemonade, and fruit drinks or punches). The items listed above may not be a complete list of foods and beverages to avoid. Contact your dietitian for more information.  This information is not intended to replace advice given to you by your health care provider. Make sure you discuss any questions you have with your health care provider. Document Released: 03/17/2012 Document Revised: 05/23/2016 Document Reviewed: 12/16/2013  2017 Elsevier

## 2016-09-13 ENCOUNTER — Encounter: Payer: Self-pay | Admitting: *Deleted

## 2016-09-13 ENCOUNTER — Telehealth: Payer: Self-pay

## 2016-09-13 NOTE — Telephone Encounter (Signed)
[facility] has a question about the increase on his medication that was done during his CPE visit.[phone]

## 2016-09-16 NOTE — Telephone Encounter (Signed)
Pt called but he did not answer. I left a voicemail explaining the increase in his medication and instructed him to call back if he has further questions.

## 2016-09-17 ENCOUNTER — Telehealth: Payer: Self-pay

## 2016-09-17 NOTE — Telephone Encounter (Signed)
Pt says he didn't understand all of Wiseman's phone message and would like her to call him back.  He is off for the holiday, so call any time.  [phone]

## 2016-09-17 NOTE — Telephone Encounter (Signed)
Routed to Wiseman.  

## 2016-09-20 NOTE — Telephone Encounter (Signed)
Pt contacted via phone. He wanted to let me know that he has actually only been taking 1/2 of his 10mg  lipitor tablet for the past year so he was not sure if he should increase to the whole tablet vs taking 20mg  as I had originally stated in the voicemail. Due to his minimal increase in LDL compared to last, I think it is reasonable for pt to take 10mg  lipitor daily and return in 6 months for repeat lipid panel. We can decide at this time if he needs to increase to 20mg .

## 2017-09-12 ENCOUNTER — Other Ambulatory Visit: Payer: Self-pay | Admitting: Physician Assistant

## 2017-10-21 ENCOUNTER — Telehealth: Payer: Self-pay

## 2017-10-21 NOTE — Telephone Encounter (Signed)
CPE scheduled for 10/29/17  Copied from CRM [phone]. Topic: Inquiry >> Oct 21, 2017 12:28 PM Cipriano BunkerLambe, Annette S wrote: Reason for CRM:  Pt will need order put in for lab and he will come in Thursday or Friday 1/24 - 1/25

## 2017-10-22 ENCOUNTER — Other Ambulatory Visit: Payer: Self-pay | Admitting: Physician Assistant

## 2017-10-22 DIAGNOSIS — Z130 Encounter for screening for diseases of the blood and blood-forming organs and certain disorders involving the immune mechanism: Secondary | ICD-10-CM

## 2017-10-22 DIAGNOSIS — Z13228 Encounter for screening for other metabolic disorders: Secondary | ICD-10-CM

## 2017-10-22 DIAGNOSIS — E785 Hyperlipidemia, unspecified: Secondary | ICD-10-CM

## 2017-10-22 DIAGNOSIS — Z1329 Encounter for screening for other suspected endocrine disorder: Secondary | ICD-10-CM

## 2017-10-22 NOTE — Telephone Encounter (Signed)
I have placed the orders. Thanks. 

## 2017-10-29 ENCOUNTER — Ambulatory Visit (INDEPENDENT_AMBULATORY_CARE_PROVIDER_SITE_OTHER): Payer: 59 | Admitting: Physician Assistant

## 2017-10-29 ENCOUNTER — Encounter: Payer: Self-pay | Admitting: Physician Assistant

## 2017-10-29 ENCOUNTER — Other Ambulatory Visit: Payer: Self-pay

## 2017-10-29 VITALS — BP 114/62 | HR 66 | Temp 98.3°F | Resp 18 | Ht 77.76 in | Wt 221.6 lb

## 2017-10-29 DIAGNOSIS — Z13228 Encounter for screening for other metabolic disorders: Secondary | ICD-10-CM | POA: Diagnosis not present

## 2017-10-29 DIAGNOSIS — E785 Hyperlipidemia, unspecified: Secondary | ICD-10-CM | POA: Diagnosis not present

## 2017-10-29 DIAGNOSIS — Z1389 Encounter for screening for other disorder: Secondary | ICD-10-CM

## 2017-10-29 DIAGNOSIS — Z130 Encounter for screening for diseases of the blood and blood-forming organs and certain disorders involving the immune mechanism: Secondary | ICD-10-CM | POA: Diagnosis not present

## 2017-10-29 DIAGNOSIS — Z1329 Encounter for screening for other suspected endocrine disorder: Secondary | ICD-10-CM

## 2017-10-29 DIAGNOSIS — Z0000 Encounter for general adult medical examination without abnormal findings: Secondary | ICD-10-CM

## 2017-10-29 MED ORDER — ATORVASTATIN CALCIUM 10 MG PO TABS: ORAL_TABLET | ORAL | 3 refills | Status: DC

## 2017-10-29 NOTE — Patient Instructions (Addendum)
It was a pleasure seeing you today. We should have your labs back within one week and will contact you with those results. Thank you for letting me participate in your health and well being.   Health Maintenance, Male A healthy lifestyle and preventive care is important for your health and wellness. Ask your health care provider about what schedule of regular examinations is right for you. What should I know about weight and diet? Eat a Healthy Diet  Eat plenty of vegetables, fruits, whole grains, low-fat dairy products, and lean protein.  Do not eat a lot of foods high in solid fats, added sugars, or salt.  Maintain a Healthy Weight Regular exercise can help you achieve or maintain a healthy weight. You should:  Do at least 150 minutes of exercise each week. The exercise should increase your heart rate and make you sweat (moderate-intensity exercise).  Do strength-training exercises at least twice a week.  Watch Your Levels of Cholesterol and Blood Lipids  Have your blood tested for lipids and cholesterol every 5 years starting at 55 years of age. If you are at high risk for heart disease, you should start having your blood tested when you are 55 years old. You may need to have your cholesterol levels checked more often if: ? Your lipid or cholesterol levels are high. ? You are older than 55 years of age. ? You are at high risk for heart disease.  What should I know about cancer screening? Many types of cancers can be detected early and may often be prevented. Lung Cancer  You should be screened every year for lung cancer if: ? You are a current smoker who has smoked for at least 30 years. ? You are a former smoker who has quit within the past 15 years.  Talk to your health care provider about your screening options, when you should start screening, and how often you should be screened.  Colorectal Cancer  Routine colorectal cancer screening usually begins at 55 years of age  and should be repeated every 5-10 years until you are 55 years old. You may need to be screened more often if early forms of precancerous polyps or small growths are found. Your health care provider may recommend screening at an earlier age if you have risk factors for colon cancer.  Your health care provider may recommend using home test kits to check for hidden blood in the stool.  A small camera at the end of a tube can be used to examine your colon (sigmoidoscopy or colonoscopy). This checks for the earliest forms of colorectal cancer.  Prostate and Testicular Cancer  Depending on your age and overall health, your health care provider may do certain tests to screen for prostate and testicular cancer.  Talk to your health care provider about any symptoms or concerns you have about testicular or prostate cancer.  Skin Cancer  Check your skin from head to toe regularly.  Tell your health care provider about any new moles or changes in moles, especially if: ? There is a change in a mole's size, shape, or color. ? You have a mole that is larger than a pencil eraser.  Always use sunscreen. Apply sunscreen liberally and repeat throughout the day.  Protect yourself by wearing long sleeves, pants, a wide-brimmed hat, and sunglasses when outside.  What should I know about heart disease, diabetes, and high blood pressure?  If you are 5318-55 years of age, have your blood pressure checked every  3-5 years. If you are 52 years of age or older, have your blood pressure checked every year. You should have your blood pressure measured twice-once when you are at a hospital or clinic, and once when you are not at a hospital or clinic. Record the average of the two measurements. To check your blood pressure when you are not at a hospital or clinic, you can use: ? An automated blood pressure machine at a pharmacy. ? A home blood pressure monitor.  Talk to your health care provider about your target blood  pressure.  If you are between 76-71 years old, ask your health care provider if you should take aspirin to prevent heart disease.  Have regular diabetes screenings by checking your fasting blood sugar level. ? If you are at a normal weight and have a low risk for diabetes, have this test once every three years after the age of 61. ? If you are overweight and have a high risk for diabetes, consider being tested at a younger age or more often.  A one-time screening for abdominal aortic aneurysm (AAA) by ultrasound is recommended for men aged 65-75 years who are current or former smokers. What should I know about preventing infection? Hepatitis B If you have a higher risk for hepatitis B, you should be screened for this virus. Talk with your health care provider to find out if you are at risk for hepatitis B infection. Hepatitis C Blood testing is recommended for:  Everyone born from [birth year] through 1963.  Anyone with known risk factors for hepatitis C.  Sexually Transmitted Diseases (STDs)  You should be screened each year for STDs including gonorrhea and chlamydia if: ? You are sexually active and are younger than 55 years of age. ? You are older than 55 years of age and your health care provider tells you that you are at risk for this type of infection. ? Your sexual activity has changed since you were last screened and you are at an increased risk for chlamydia or gonorrhea. Ask your health care provider if you are at risk.  Talk with your health care provider about whether you are at high risk of being infected with HIV. Your health care provider may recommend a prescription medicine to help prevent HIV infection.  What else can I do?  Schedule regular health, dental, and eye exams.  Stay current with your vaccines (immunizations).  Do not use any tobacco products, such as cigarettes, chewing tobacco, and e-cigarettes. If you need help quitting, ask your health care  provider.  Limit alcohol intake to no more than 2 drinks per day. One drink equals 12 ounces of beer, 5 ounces of wine, or 1 ounces of hard liquor.  Do not use street drugs.  Do not share needles.  Ask your health care provider for help if you need support or information about quitting drugs.  Tell your health care provider if you often feel depressed.  Tell your health care provider if you have ever been abused or do not feel safe at home. This information is not intended to replace advice given to you by your health care provider. Make sure you discuss any questions you have with your health care provider. Document Released: 03/14/2008 Document Revised: 05/15/2016 Document Reviewed: 06/20/2015 Elsevier Interactive Patient Education  2018 [employer].     IF you received an x-ray today, you will receive an invoice from [facility]. Please contact [facility] at [phone] with questions or concerns  regarding your invoice.   IF you received labwork today, you will receive an invoice from [facility]. Please contact [facility] at [phone] with questions or concerns regarding your invoice.   Our billing staff will not be able to assist you with questions regarding bills from these companies.  You will be contacted with the lab results as soon as they are available. The fastest way to get your results is to activate your My Chart account. Instructions are located on the last page of this paperwork. If you have not heard from [place] regarding the results in 2 weeks, please contact this office.

## 2017-10-29 NOTE — Progress Notes (Signed)
Christian Richards  MRN: [id number] DOB: 1961/05/24  Subjective:  Pt is a 57 y.o. male who presents for annual physical exam. Pt is fasting today.   Exercise: Goes three times a week. Does cardio and weights.  Diet: Eating rice, frozen vegetables, canned tuna or chicken. Does not go out a lot. Drinks a lot of coffee and some water. Takes multivitamin.  Sleep: Gets about 6-7 hours a night. Feels rested throughout the day.  BM: Once daily, normal for him.   Last annual exam: 07/2016 Last dental exam: 07/2017 Last vision exam: Years ago Last colonoscopy: Never, has does 3 cologuards which have all been normal. Vaccinations      Tetanus: 2015      Influenza: 06/2017  Chronic conditions: 1) Hyperlipidemia: Has been on atorvastatin 81m for quite sometime, he cannot recall how long but notes it has been longer than 5 years.    Patient Active Problem List   Diagnosis Date Noted  . Other and unspecified hyperlipidemia 05/17/2014    Current Outpatient Medications on File Prior to Visit  Medication Sig Dispense Refill  . aspirin EC 81 MG tablet Take 81 mg by mouth daily.    .Marland Kitchenatorvastatin (LIPITOR) 10 MG tablet TAKE 1 TABLET(10 MG) BY MOUTH DAILY 90 tablet 0  . ibuprofen (ADVIL,MOTRIN) 200 MG tablet Take 200 mg by mouth at bedtime.    . Multiple Vitamin (MULTIVITAMIN) tablet Take 1 tablet by mouth daily.    .Marland KitchenOVER THE COUNTER MEDICATION OTC cimetidine taking    . OVER THE COUNTER MEDICATION OTC Fish oil taking     No current facility-administered medications on file prior to visit.     No Known Allergies  Social History   Socioeconomic History  . Marital status: Single    Spouse name: None  . Number of children: None  . Years of education: None  . Highest education level: None  Social Needs  . Financial resource strain: Not hard at all  . Food insecurity - worry: Never true  . Food insecurity - inability: Never true  . Transportation needs - medical: No  . Transportation  needs - non-medical: No  Occupational History  . Occupation: MFreight forwarderat [employer] Tobacco Use  . Smoking status: Never Smoker  . Smokeless tobacco: Never Used  Substance and Sexual Activity  . Alcohol use: Yes    Comment:  social: 5-6 drinks a week  . Drug use: No  . Sexual activity: No  Other Topics Concern  . None  Social History Narrative   Works as a tScientist, clinical (histocompatibility and immunogenetics) He is divorced and has one 851yo child. Pt is not sexually active at this time.    No past surgical history on file.  Family History  Problem Relation Age of Onset  . Cancer Mother        liver cancer  . Hypertension Father   . Leukemia Father   . Cancer Father        leukemia   . Arthritis Son     Review of Systems  Constitutional: Negative for activity change, appetite change, chills, diaphoresis, fatigue, fever and unexpected weight change.  HENT: Negative for congestion, dental problem, drooling, ear discharge, ear pain, facial swelling, hearing loss, mouth sores, nosebleeds, postnasal drip, rhinorrhea, sinus pressure, sinus pain, sneezing, sore throat, tinnitus, trouble swallowing and voice change.   Eyes: Negative for photophobia, pain, discharge, redness, itching and visual disturbance.  Respiratory: Negative  for apnea, cough, choking, chest tightness, shortness of breath, wheezing and stridor.   Cardiovascular: Negative for chest pain, palpitations and leg swelling.  Gastrointestinal: Negative for abdominal distention, abdominal pain, anal bleeding, blood in stool, constipation, diarrhea, nausea, rectal pain and vomiting.  Endocrine: Negative for cold intolerance, heat intolerance, polydipsia, polyphagia and polyuria.  Genitourinary: Negative for decreased urine volume, difficulty urinating, discharge, dysuria, enuresis, flank pain, frequency, genital sores, hematuria, penile pain, penile swelling, scrotal swelling, testicular pain and urgency.    Musculoskeletal: Negative for arthralgias, back pain, gait problem, joint swelling, myalgias, neck pain and neck stiffness.  Skin: Negative for color change, pallor, rash and wound.  Allergic/Immunologic: Negative for environmental allergies, food allergies and immunocompromised state.  Neurological: Negative for dizziness, tremors, seizures, syncope, facial asymmetry, speech difficulty, weakness, light-headedness, numbness and headaches.  Hematological: Negative for adenopathy. Does not bruise/bleed easily.  Psychiatric/Behavioral: Negative for agitation, behavioral problems, confusion, decreased concentration, dysphoric mood, hallucinations, self-injury, sleep disturbance and suicidal ideas. The patient is not nervous/anxious and is not hyperactive.     Objective:  BP 114/62   Pulse 66   Temp 98.3 F (36.8 C) (Oral)   Resp 18   Ht 6' 5.76" (1.975 m)   Wt 221 lb 9.6 oz (100.5 kg)   SpO2 97%   BMI 25.77 kg/m   Physical Exam  Constitutional: He is oriented to person, place, and time and well-developed, well-nourished, and in no distress.  HENT:  Head: Normocephalic and atraumatic.  Right Ear: Hearing, tympanic membrane, external ear and ear canal normal.  Left Ear: Hearing, tympanic membrane, external ear and ear canal normal.  Nose: Nose normal.  Mouth/Throat: Uvula is midline, oropharynx is clear and moist and mucous membranes are normal. No oropharyngeal exudate.  Polyp noted on uvula.  Eyes: Conjunctivae and EOM are normal. Pupils are equal, round, and reactive to light.  Neck: Trachea normal and normal range of motion.  Cardiovascular: Normal rate, regular rhythm, normal heart sounds and intact distal pulses.  Pulmonary/Chest: Effort normal and breath sounds normal.  Abdominal: Soft. Normal appearance and bowel sounds are normal.  Musculoskeletal: Normal range of motion.  Lymphadenopathy:       Head (right side): No submental, no submandibular, no tonsillar, no preauricular,  no posterior auricular and no occipital adenopathy present.       Head (left side): No submental, no submandibular, no tonsillar, no preauricular, no posterior auricular and no occipital adenopathy present.    He has no cervical adenopathy.       Right: No supraclavicular adenopathy present.       Left: No supraclavicular adenopathy present.  Neurological: He is alert and oriented to person, place, and time. He has normal sensation, normal strength and normal reflexes. Gait normal.  Skin: Skin is warm and dry.  Psychiatric: Affect normal.  Vitals reviewed.   Visual Acuity Screening   Right eye Left eye Both eyes  Without correction: 20/15-1 20/15 20/13  With correction:      No results found for this or any previous visit (from the past 24 hour(s)).   Assessment and Plan :  Discussed healthy lifestyle, diet, exercise, preventative care, vaccinations, and addressed patient's concerns. Plan for follow up in one year. Otherwise, plan for specific conditions below.  1. Annual physical exam Await lab results.   2. Hyperlipidemia, unspecified hyperlipidemia type - Lipid panel - atorvastatin (LIPITOR) 10 MG tablet; TAKE 1 TABLET(10 MG) BY MOUTH DAILY  Dispense: 90 tablet; Refill: 3  3. Screening for  hematuria or proteinuria - Urinalysis, dipstick only  4. Screening for thyroid disorder - TSH  5. Screening, anemia, deficiency, iron - CBC with Differential/Platelet  6. Screening for metabolic disorder - [id number]  Tyjon Bowen, PA-C  [facility] [place] 10/29/2017 9:15 AM

## 2017-10-30 ENCOUNTER — Encounter: Payer: Self-pay | Admitting: *Deleted

## 2017-10-30 LAB — URINALYSIS, DIPSTICK ONLY
BILIRUBIN UA: NEGATIVE
GLUCOSE, UA: NEGATIVE
Ketones, UA: NEGATIVE
Leukocytes, UA: NEGATIVE
Nitrite, UA: NEGATIVE
PH UA: 6.5 (ref 5.0–7.5)
PROTEIN UA: NEGATIVE
RBC, UA: NEGATIVE
Specific Gravity, UA: 1.011 (ref 1.005–1.030)
Urobilinogen, Ur: 0.2 mg/dL (ref 0.2–1.0)

## 2017-10-30 LAB — CBC WITH DIFFERENTIAL/PLATELET
BASOS: 1 %
Basophils Absolute: 0 10*3/uL (ref 0.0–0.2)
EOS (ABSOLUTE): 0.2 10*3/uL (ref 0.0–0.4)
EOS: 4 %
HEMATOCRIT: 46.1 % (ref 37.5–51.0)
Hemoglobin: 16.4 g/dL (ref 13.0–17.7)
IMMATURE GRANS (ABS): 0 10*3/uL (ref 0.0–0.1)
IMMATURE GRANULOCYTES: 0 %
LYMPHS: 30 %
Lymphocytes Absolute: 1.4 10*3/uL (ref 0.7–3.1)
MCH: 33.7 pg — AB (ref 26.6–33.0)
MCHC: 35.6 g/dL (ref 31.5–35.7)
MCV: 95 fL (ref 79–97)
Monocytes Absolute: 0.4 10*3/uL (ref 0.1–0.9)
Monocytes: 9 %
NEUTROS ABS: 2.7 10*3/uL (ref 1.4–7.0)
NEUTROS PCT: 56 %
Platelets: 180 10*3/uL (ref 150–379)
RBC: 4.86 x10E6/uL (ref 4.14–5.80)
RDW: 13.8 % (ref 12.3–15.4)
WBC: 4.8 10*3/uL (ref 3.4–10.8)

## 2017-10-30 LAB — CMP14+EGFR
ALK PHOS: 57 IU/L (ref 39–117)
ALT: 21 IU/L (ref 0–44)
AST: 22 IU/L (ref 0–40)
Albumin/Globulin Ratio: 1.5 (ref 1.2–2.2)
Albumin: 4.2 g/dL (ref 3.5–5.5)
BILIRUBIN TOTAL: 0.9 mg/dL (ref 0.0–1.2)
BUN/Creatinine Ratio: 16 (ref 9–20)
BUN: 15 mg/dL (ref 6–24)
CHLORIDE: 101 mmol/L (ref 96–106)
CO2: 23 mmol/L (ref 20–29)
CREATININE: 0.96 mg/dL (ref 0.76–1.27)
Calcium: 9.3 mg/dL (ref 8.7–10.2)
GFR calc Af Amer: 103 mL/min/{1.73_m2} (ref >59)
GFR calc non Af Amer: 89 mL/min/{1.73_m2} (ref >59)
GLUCOSE: 93 mg/dL (ref 65–99)
Globulin, Total: 2.8 g/dL (ref 1.5–4.5)
Potassium: 4.5 mmol/L (ref 3.5–5.2)
Sodium: 140 mmol/L (ref 134–144)
Total Protein: 7 g/dL (ref 6.0–8.5)

## 2017-10-30 LAB — LIPID PANEL
CHOL/HDL RATIO: 3.8 ratio (ref 0.0–5.0)
Cholesterol, Total: 165 mg/dL (ref 100–199)
HDL: 44 mg/dL (ref >39)
LDL CALC: 94 mg/dL (ref 0–99)
Triglycerides: 135 mg/dL (ref 0–149)
VLDL CHOLESTEROL CAL: 27 mg/dL (ref 5–40)

## 2017-10-30 LAB — TSH: TSH: 1.98 u[IU]/mL (ref 0.450–4.500)

## 2017-12-22 ENCOUNTER — Telehealth: Payer: Self-pay | Admitting: Physician Assistant

## 2017-12-22 NOTE — Telephone Encounter (Signed)
Copied from CRM [id number]. Topic: Quick Communication - Rx Refill/Question >> Dec 22, 2017  9:52 AM Stephannie LiSimmons, Yaniris Braddock L, NT wrote: Medication: atorvastatin (LIPITOR) 10 MG tablet Has the patient contacted their pharmacy? {yes  (Agent: If no, request that the patient contact the pharmacy for the refill.) Preferred Pharmacy (with phone number or street name): [id number] [place] - [id number] [facility] [address] [phone] (Phone) [phone] (Fax     Agent: Please be advised that RX refills may take up to 3 business days. We ask that you follow-up with your pharmacy.

## 2017-12-22 NOTE — Telephone Encounter (Signed)
Left VM re: refill request. Pt should have refills available.

## 2018-10-29 ENCOUNTER — Other Ambulatory Visit: Payer: Self-pay | Admitting: Physician Assistant

## 2018-10-29 DIAGNOSIS — E785 Hyperlipidemia, unspecified: Secondary | ICD-10-CM

## 2018-10-29 NOTE — Telephone Encounter (Signed)
Patient has not been seen >1 year. Left VM to call for appointment if wishes for refill at this time.    Requested medication (s) are due for refill today yes  Requested medication (s) are on the active medication list yes  Future visit scheduled no   Requested Prescriptions  Pending Prescriptions Disp Refills   atorvastatin (LIPITOR) 10 MG tablet [Pharmacy Med Name: ATORVASTATIN 10MG  TABLETS] 30 tablet 1    Sig: TAKE 1 TABLET(10 MG) BY MOUTH DAILY     Cardiovascular:  Antilipid - Statins Failed - 10/29/2018  3:44 AM      Failed - Total Cholesterol in normal range and within 360 days    Cholesterol, Total  Date Value Ref Range Status  10/29/2017 165 100 - 199 mg/dL Final         Failed - LDL in normal range and within 360 days    LDL Calculated  Date Value Ref Range Status  10/29/2017 94 0 - 99 mg/dL Final         Failed - HDL in normal range and within 360 days    HDL  Date Value Ref Range Status  10/29/2017 44 >39 mg/dL Final         Failed - Triglycerides in normal range and within 360 days    Triglycerides  Date Value Ref Range Status  10/29/2017 135 0 - 149 mg/dL Final         Failed - Valid encounter within last 12 months    Recent Outpatient Visits          1 year ago Annual physical exam   [facility] [place], [place] D, PA-C   2 years ago Annual physical exam   [facility] [place], [place] D, PA-C   3 years ago Hyperlipidemia   [facility], [facility]ria Bales, MD   4 years ago Hyperlipemia   [facility], [place]   4 years ago Right-sided low back pain without sciatica   [facility], [facility]             Passed - Patient is not pregnant

## 2018-10-29 NOTE — Telephone Encounter (Signed)
Patient has not been seen > 1 year. Past provider was [place].  TC and left VM for patient to call back to schedule appointment for refills. Routing to PCP.

## 2018-12-28 ENCOUNTER — Other Ambulatory Visit: Payer: Self-pay | Admitting: Family Medicine

## 2018-12-28 DIAGNOSIS — E785 Hyperlipidemia, unspecified: Secondary | ICD-10-CM

## 2019-01-06 ENCOUNTER — Other Ambulatory Visit: Payer: Self-pay | Admitting: Physician Assistant

## 2019-01-06 DIAGNOSIS — E785 Hyperlipidemia, unspecified: Secondary | ICD-10-CM

## 2019-01-07 ENCOUNTER — Other Ambulatory Visit: Payer: Self-pay | Admitting: General Practice

## 2019-01-07 ENCOUNTER — Other Ambulatory Visit: Payer: Self-pay | Admitting: Physician Assistant

## 2019-01-07 DIAGNOSIS — E785 Hyperlipidemia, unspecified: Secondary | ICD-10-CM

## 2019-01-08 MED ORDER — ATORVASTATIN CALCIUM 10 MG PO TABS: 10.0000 mg | ORAL_TABLET | Freq: Every day | ORAL | 1 refills | Status: AC

## 2019-03-07 ENCOUNTER — Other Ambulatory Visit: Payer: Self-pay | Admitting: Physician Assistant

## 2019-03-07 DIAGNOSIS — E785 Hyperlipidemia, unspecified: Secondary | ICD-10-CM

## 2019-03-07 NOTE — Telephone Encounter (Signed)
Forwarding medication refill to provider for review - Last blood work 09/2017.

## 2019-03-11 ENCOUNTER — Encounter: Payer: Self-pay | Admitting: Emergency Medicine

## 2019-03-11 ENCOUNTER — Other Ambulatory Visit: Payer: Self-pay

## 2019-03-11 ENCOUNTER — Ambulatory Visit (INDEPENDENT_AMBULATORY_CARE_PROVIDER_SITE_OTHER): Payer: 59 | Admitting: Emergency Medicine

## 2019-03-11 VITALS — BP 145/80 | HR 65 | Temp 98.3°F | Resp 16 | Ht 78.25 in | Wt 232.6 lb

## 2019-03-11 DIAGNOSIS — Z0000 Encounter for general adult medical examination without abnormal findings: Secondary | ICD-10-CM

## 2019-03-11 DIAGNOSIS — Z130 Encounter for screening for diseases of the blood and blood-forming organs and certain disorders involving the immune mechanism: Secondary | ICD-10-CM | POA: Diagnosis not present

## 2019-03-11 DIAGNOSIS — Z1329 Encounter for screening for other suspected endocrine disorder: Secondary | ICD-10-CM | POA: Diagnosis not present

## 2019-03-11 DIAGNOSIS — Z0001 Encounter for general adult medical examination with abnormal findings: Secondary | ICD-10-CM

## 2019-03-11 DIAGNOSIS — E785 Hyperlipidemia, unspecified: Secondary | ICD-10-CM

## 2019-03-11 DIAGNOSIS — Z13220 Encounter for screening for lipoid disorders: Secondary | ICD-10-CM

## 2019-03-11 DIAGNOSIS — Z13228 Encounter for screening for other metabolic disorders: Secondary | ICD-10-CM

## 2019-03-11 DIAGNOSIS — Z125 Encounter for screening for malignant neoplasm of prostate: Secondary | ICD-10-CM

## 2019-03-11 DIAGNOSIS — L989 Disorder of the skin and subcutaneous tissue, unspecified: Secondary | ICD-10-CM

## 2019-03-11 LAB — POCT URINALYSIS DIP (MANUAL ENTRY)
Bilirubin, UA: NEGATIVE
Blood, UA: NEGATIVE
Glucose, UA: NEGATIVE mg/dL
Ketones, POC UA: NEGATIVE mg/dL
Leukocytes, UA: NEGATIVE
Nitrite, UA: NEGATIVE
Protein Ur, POC: NEGATIVE mg/dL
Spec Grav, UA: 1.02 (ref 1.010–1.025)
Urobilinogen, UA: 0.2 E.U./dL
pH, UA: 5.5 (ref 5.0–8.0)

## 2019-03-11 MED ORDER — ATORVASTATIN CALCIUM 10 MG PO TABS: 10.0000 mg | ORAL_TABLET | Freq: Every day | ORAL | 3 refills | Status: DC

## 2019-03-11 NOTE — Progress Notes (Signed)
Christian Richards 58 y.o.   Chief Complaint  Patient presents with  . Annual Exam  . Medication Refill    Atorvastatin    HISTORY OF PRESENT ILLNESS: This is a 58 y.o. male here for his annual exam.  Has no complaints or medical concerns today. Has history of dyslipidemia, needs atorvastatin refilled. Non-smoker and no EtOH abuser.  Healthy lifestyle.  Adequate sleep and good nutrition. Has done Cologuard several times, always negative. Has several skin lesions he is concerned about. HPI   Prior to Admission medications   Medication Sig Start Date End Date Taking? Authorizing Provider  aspirin 325 MG tablet Take 325 mg by mouth daily.   Yes [provider]  atorvastatin (LIPITOR) 10 MG tablet Take 1 tablet (10 mg total) by mouth daily. 01/08/19  Yes Barnett Abu, [place] D, PA-C  famotidine (PEPCID) 10 MG tablet Take 10 mg by mouth daily.   Yes [provider]  ibuprofen (ADVIL,MOTRIN) 200 MG tablet Take 200 mg by mouth at bedtime.   Yes [provider]  Multiple Vitamin (MULTIVITAMIN) tablet Take 1 tablet by mouth daily.   Yes [provider]  OVER THE COUNTER MEDICATION OTC Fish oil taking   Yes [provider]  OVER THE COUNTER MEDICATION OTC cimetidine taking    [provider]    No Known Allergies  Patient Active Problem List   Diagnosis Date Noted  . Other and unspecified hyperlipidemia 05/17/2014    History reviewed. No pertinent past medical history.  History reviewed. No pertinent surgical history.  Social History   Socioeconomic History  . Marital status: Single    Spouse name: Not on file  . Number of children: Not on file  . Years of education: Not on file  . Highest education level: Not on file  Occupational History  . Occupation: Production designer, theatre/television/film at [employer]: [employer]  Social Needs  . Financial resource strain: Not hard at all  . Food insecurity    Worry: Never true   Inability: Never true  . Transportation needs    Medical: No    Non-medical: No  Tobacco Use  . Smoking status: Never Smoker  . Smokeless tobacco: Never Used  Substance and Sexual Activity  . Alcohol use: Yes    Comment:  social: 5-6 drinks a week  . Drug use: No  . Sexual activity: Never  Lifestyle  . Physical activity    Days per week: 3 days    Minutes per session: 60 min  . Stress: Only a little  Relationships  . Social Musician on phone: Three times a week    Gets together: Twice a week    Attends religious service: More than 4 times per year    Active member of club or organization: Yes    Attends meetings of clubs or organizations: More than 4 times per year    Relationship status: Divorced  . Intimate partner violence    Fear of current or ex partner: No    Emotionally abused: No    Physically abused: No    Forced sexual activity: No  Other Topics Concern  . Not on file  Social History Narrative   Works as a Secondary school teacher. He is divorced and has one 35 yo child. Pt is not sexually active at this time.    Family History  Problem Relation Age of Onset  . Cancer Mother  liver cancer  . Hypertension Father   . Leukemia Father   . Cancer Father        leukemia   . Arthritis Son      Review of Systems  Constitutional: Negative.  Negative for chills and fever.  HENT: Negative.  Negative for congestion, hearing loss, nosebleeds and sore throat.   Eyes: Negative for blurred vision and double vision.  Respiratory: Negative.  Negative for cough and shortness of breath.   Cardiovascular: Negative.  Negative for chest pain and palpitations.  Gastrointestinal: Negative.  Negative for abdominal pain, blood in stool, diarrhea, melena, nausea and vomiting.  Genitourinary: Negative.  Negative for dysuria and hematuria.  Musculoskeletal: Negative.  Negative for back pain, myalgias and neck pain.  Skin:       Several dark skin lesions on trunk and  extremities  Neurological: Negative.  Negative for dizziness, sensory change, focal weakness, seizures, loss of consciousness and headaches.  Endo/Heme/Allergies: Negative.   All other systems reviewed and are negative.  Vitals:   03/11/19 1322  BP: (!) 145/80  Pulse: 65  Resp: 16  Temp: 98.3 F (36.8 C)  SpO2: 97%     Physical Exam Vitals signs reviewed.  Constitutional:      Appearance: Normal appearance.  HENT:     Head: Normocephalic and atraumatic.     Nose: Nose normal.     Mouth/Throat:     Mouth: Mucous membranes are moist.     Pharynx: Oropharynx is clear.  Eyes:     Extraocular Movements: Extraocular movements intact.     Conjunctiva/sclera: Conjunctivae normal.     Pupils: Pupils are equal, round, and reactive to light.  Neck:     Musculoskeletal: Normal range of motion and neck supple.  Cardiovascular:     Rate and Rhythm: Normal rate and regular rhythm.     Pulses: Normal pulses.     Heart sounds: Normal heart sounds.  Pulmonary:     Effort: Pulmonary effort is normal.     Breath sounds: Normal breath sounds.  Abdominal:     General: There is no distension.     Palpations: Abdomen is soft. There is no mass.     Tenderness: There is no abdominal tenderness.  Musculoskeletal: Normal range of motion.     Right lower leg: No edema.     Left lower leg: No edema.  Lymphadenopathy:     Cervical: No cervical adenopathy.  Skin:    General: Skin is warm and dry.     Comments: Several small dark slightly raised skin lesions with irregular borders in the trunk and extremities  Neurological:     General: No focal deficit present.     Mental Status: He is alert and oriented to person, place, and time.  Psychiatric:        Mood and Affect: Mood normal.        Behavior: Behavior normal.      ASSESSMENT & PLAN: Christian Richards was seen today for annual exam and medication refill.  Diagnoses and all orders for this visit:  Routine general medical examination at a  health care facility -     Comprehensive metabolic panel -     POCT urinalysis dipstick  Screening for deficiency anemia -     CBC with Differential/Platelet  Screening for lipoid disorders -     Lipid panel  Screening for endocrine, metabolic and immunity disorder -     Comprehensive metabolic panel -  Hemoglobin A1c  Prostate cancer screening -     PSA  Skin lesions -     Ambulatory referral to Dermatology  Hyperlipidemia, unspecified hyperlipidemia type -     atorvastatin (LIPITOR) 10 MG tablet; Take 1 tablet (10 mg total) by mouth daily.     Patient Instructions       If you have lab work done today you will be contacted with your lab results within the next 2 weeks.  If you have not heard from us then please contact us. The fastest way to get your results is to register for My Chart.   IF you received an x-ray today, you will receive an invoice from [facility]. Please contact [facility] at [phone] with questions or concerns regarding your invoice.   IF you received labwork today, you will receive an invoice from [facility]. Please contact [facility] at [phone] with questions or concerns regarding your invoice.   Our billing staff will not be able to assist you with questions regarding bills from these companies.  You will be contacted with the lab results as soon as they are available. The fastest way to get your results is to activate your My Chart account. Instructions are located on the last page of this paperwork. If you have not heard from us regarding the results in 2 weeks, please contact this office.      Health Maintenance, Male A healthy lifestyle and preventive care is important for your health and wellness. Ask your health care provider about what schedule of regular examinations is right for you. What should I know about weight and diet? Eat a Healthy Diet  Eat plenty of vegetables, fruits, whole grains, low-fat dairy  products, and lean protein.  Do not eat a lot of foods high in solid fats, added sugars, or salt.  Maintain a Healthy Weight Regular exercise can help you achieve or maintain a healthy weight. You should:  Do at least 150 minutes of exercise each week. The exercise should increase your heart rate and make you sweat (moderate-intensity exercise).  Do strength-training exercises at least twice a week. Watch Your Levels of Cholesterol and Blood Lipids  Have your blood tested for lipids and cholesterol every 5 years starting at 56 years of age. If you are at high risk for heart disease, you should start having your blood tested when you are 56 years old. You may need to have your cholesterol levels checked more often if: ? Your lipid or cholesterol levels are high. ? You are older than 56 years of age. ? You are at high risk for heart disease. What should I know about cancer screening? Many types of cancers can be detected early and may often be prevented. Lung Cancer  You should be screened every year for lung cancer if: ? You are a current smoker who has smoked for at least 30 years. ? You are a former smoker who has quit within the past 15 years.  Talk to your health care provider about your screening options, when you should start screening, and how often you should be screened. Colorectal Cancer  Routine colorectal cancer screening usually begins at 56 years of age and should be repeated every 5-10 years until you are 56 years old. You may need to be screened more often if early forms of precancerous polyps or small growths are found. Your health care provider may recommend screening at an earlier age if you have risk factors for colon cancer.  Your health care provider may recommend using home test kits to check for hidden blood in the stool.  A small camera at the end of a tube can be used to examine your colon (sigmoidoscopy or colonoscopy). This checks for the earliest forms of  colorectal cancer. Prostate and Testicular Cancer  Depending on your age and overall health, your health care provider may do certain tests to screen for prostate and testicular cancer.  Talk to your health care provider about any symptoms or concerns you have about testicular or prostate cancer. Skin Cancer  Check your skin from head to toe regularly.  Tell your health care provider about any new moles or changes in moles, especially if: ? There is a change in a mole's size, shape, or color. ? You have a mole that is larger than a pencil eraser.  Always use sunscreen. Apply sunscreen liberally and repeat throughout the day.  Protect yourself by wearing long sleeves, pants, a wide-brimmed hat, and sunglasses when outside. What should I know about heart disease, diabetes, and high blood pressure?  If you are 51-1 years of age, have your blood pressure checked every 3-5 years. If you are 26 years of age or older, have your blood pressure checked every year. You should have your blood pressure measured twice-once when you are at a hospital or clinic, and once when you are not at a hospital or clinic. Record the average of the two measurements. To check your blood pressure when you are not at a hospital or clinic, you can use: ? An automated blood pressure machine at a pharmacy. ? A home blood pressure monitor.  Talk to your health care provider about your target blood pressure.  If you are between 48-96 years old, ask your health care provider if you should take aspirin to prevent heart disease.  Have regular diabetes screenings by checking your fasting blood sugar level. ? If you are at a normal weight and have a low risk for diabetes, have this test once every three years after the age of 6. ? If you are overweight and have a high risk for diabetes, consider being tested at a younger age or more often.  A one-time screening for abdominal aortic aneurysm (AAA) by ultrasound is  recommended for men aged 67-75 years who are current or former smokers. What should I know about preventing infection? Hepatitis B If you have a higher risk for hepatitis B, you should be screened for this virus. Talk with your health care provider to find out if you are at risk for hepatitis B infection. Hepatitis C Blood testing is recommended for:  Everyone born from [birth year] through 1963.  Anyone with known risk factors for hepatitis C. Sexually Transmitted Diseases (STDs)  You should be screened each year for STDs including gonorrhea and chlamydia if: ? You are sexually active and are younger than 56 years of age. ? You are older than 56 years of age and your health care provider tells you that you are at risk for this type of infection. ? Your sexual activity has changed since you were last screened and you are at an increased risk for chlamydia or gonorrhea. Ask your health care provider if you are at risk.  Talk with your health care provider about whether you are at high risk of being infected with HIV. Your health care provider may recommend a prescription medicine to help prevent HIV infection. What else can I do?  Schedule regular health, dental,  and eye exams.  Stay current with your vaccines (immunizations).  Do not use any tobacco products, such as cigarettes, chewing tobacco, and e-cigarettes. If you need help quitting, ask your health care provider.  Limit alcohol intake to no more than 2 drinks per day. One drink equals 12 ounces of beer, 5 ounces of wine, or 1 ounces of hard liquor.  Do not use street drugs.  Do not share needles.  Ask your health care provider for help if you need support or information about quitting drugs.  Tell your health care provider if you often feel depressed.  Tell your health care provider if you have ever been abused or do not feel safe at home. This information is not intended to replace advice given to you by your health care  provider. Make sure you discuss any questions you have with your health care provider. Document Released: 03/14/2008 Document Revised: 05/15/2016 Document Reviewed: 06/20/2015 Elsevier Interactive Patient Education  2019 [employer]      Edwina BarthMiguel Aiman Noe, MD [facility]

## 2019-03-11 NOTE — Patient Instructions (Addendum)

## 2019-03-12 LAB — CBC WITH DIFFERENTIAL/PLATELET
Basophils Absolute: 0 10*3/uL (ref 0.0–0.2)
Basos: 1 %
EOS (ABSOLUTE): 0.1 10*3/uL (ref 0.0–0.4)
Eos: 1 %
Hematocrit: 46 % (ref 37.5–51.0)
Hemoglobin: 16 g/dL (ref 13.0–17.7)
Immature Grans (Abs): 0 10*3/uL (ref 0.0–0.1)
Immature Granulocytes: 0 %
Lymphocytes Absolute: 1.6 10*3/uL (ref 0.7–3.1)
Lymphs: 25 %
MCH: 32.6 pg (ref 26.6–33.0)
MCHC: 34.8 g/dL (ref 31.5–35.7)
MCV: 94 fL (ref 79–97)
Monocytes Absolute: 0.5 10*3/uL (ref 0.1–0.9)
Monocytes: 7 %
Neutrophils Absolute: 4.4 10*3/uL (ref 1.4–7.0)
Neutrophils: 66 %
Platelets: 204 10*3/uL (ref 150–450)
RBC: 4.91 x10E6/uL (ref 4.14–5.80)
RDW: 12.9 % (ref 11.6–15.4)
WBC: 6.6 10*3/uL (ref 3.4–10.8)

## 2019-03-12 LAB — COMPREHENSIVE METABOLIC PANEL
ALT: 25 IU/L (ref 0–44)
AST: 22 IU/L (ref 0–40)
Albumin/Globulin Ratio: 1.6 (ref 1.2–2.2)
Albumin: 4.4 g/dL (ref 3.8–4.9)
Alkaline Phosphatase: 61 IU/L (ref 39–117)
BUN/Creatinine Ratio: 20 (ref 9–20)
BUN: 18 mg/dL (ref 6–24)
Bilirubin Total: 0.7 mg/dL (ref 0.0–1.2)
CO2: 24 mmol/L (ref 20–29)
Calcium: 9.2 mg/dL (ref 8.7–10.2)
Chloride: 102 mmol/L (ref 96–106)
Creatinine, Ser: 0.91 mg/dL (ref 0.76–1.27)
GFR calc Af Amer: 109 mL/min/{1.73_m2} (ref >59)
GFR calc non Af Amer: 94 mL/min/{1.73_m2} (ref >59)
Globulin, Total: 2.7 g/dL (ref 1.5–4.5)
Glucose: 87 mg/dL (ref 65–99)
Potassium: 4.2 mmol/L (ref 3.5–5.2)
Sodium: 141 mmol/L (ref 134–144)
Total Protein: 7.1 g/dL (ref 6.0–8.5)

## 2019-03-12 LAB — LIPID PANEL
Chol/HDL Ratio: 4.1 ratio (ref 0.0–5.0)
Cholesterol, Total: 165 mg/dL (ref 100–199)
HDL: 40 mg/dL (ref >39)
LDL Calculated: 99 mg/dL (ref 0–99)
Triglycerides: 131 mg/dL (ref 0–149)
VLDL Cholesterol Cal: 26 mg/dL (ref 5–40)

## 2019-03-12 LAB — HEMOGLOBIN A1C
Est. average glucose Bld gHb Est-mCnc: 117 mg/dL
Hgb A1c MFr Bld: 5.7 % — ABNORMAL HIGH (ref 4.8–5.6)

## 2019-03-12 LAB — PSA: Prostate Specific Ag, Serum: 1.2 ng/mL (ref 0.0–4.0)

## 2019-10-04 ENCOUNTER — Ambulatory Visit: Payer: 59 | Attending: Internal Medicine

## 2019-10-04 DIAGNOSIS — Z20822 Contact with and (suspected) exposure to covid-19: Secondary | ICD-10-CM

## 2019-10-05 LAB — NOVEL CORONAVIRUS, NAA: SARS-CoV-2, NAA: NOT DETECTED

## 2020-03-16 ENCOUNTER — Other Ambulatory Visit: Payer: Self-pay | Admitting: Emergency Medicine

## 2020-03-16 DIAGNOSIS — E785 Hyperlipidemia, unspecified: Secondary | ICD-10-CM

## 2021-03-14 ENCOUNTER — Other Ambulatory Visit: Payer: 59

## 2021-04-01 ENCOUNTER — Other Ambulatory Visit: Payer: Self-pay | Admitting: Emergency Medicine

## 2021-04-01 DIAGNOSIS — E785 Hyperlipidemia, unspecified: Secondary | ICD-10-CM
